# Patient Record
Sex: Female | Born: 1950 | Race: White | Hispanic: No | Smoking: Never smoker
Health system: Southern US, Community
[De-identification: ages and names within clinical notes are randomized; demographics above are authoritative.]

## PROBLEM LIST (undated history)

## (undated) DIAGNOSIS — Z8709 Personal history of other diseases of the respiratory system: Secondary | ICD-10-CM

## (undated) DIAGNOSIS — M2550 Pain in unspecified joint: Secondary | ICD-10-CM

## (undated) DIAGNOSIS — M2540 Effusion, unspecified joint: Secondary | ICD-10-CM

## (undated) DIAGNOSIS — M1990 Unspecified osteoarthritis, unspecified site: Secondary | ICD-10-CM

## (undated) DIAGNOSIS — J189 Pneumonia, unspecified organism: Secondary | ICD-10-CM

## (undated) HISTORY — PX: ESOPHAGOGASTRODUODENOSCOPY: SHX1529

---

## 1983-06-04 HISTORY — PX: PELVIC LAPAROSCOPY: SHX162

## 1990-06-03 HISTORY — PX: OTHER SURGICAL HISTORY: SHX169

## 2000-06-18 ENCOUNTER — Other Ambulatory Visit: Admission: RE | Admit: 2000-06-18 | Discharge: 2000-06-18 | Payer: Self-pay | Admitting: Obstetrics and Gynecology

## 2001-06-03 HISTORY — PX: TOTAL HIP ARTHROPLASTY: SHX124

## 2001-07-08 ENCOUNTER — Other Ambulatory Visit: Admission: RE | Admit: 2001-07-08 | Discharge: 2001-07-08 | Payer: Self-pay | Admitting: Obstetrics and Gynecology

## 2002-02-15 ENCOUNTER — Encounter: Payer: Self-pay | Admitting: Orthopedic Surgery

## 2002-02-17 ENCOUNTER — Inpatient Hospital Stay (HOSPITAL_COMMUNITY): Admission: RE | Admit: 2002-02-17 | Discharge: 2002-02-19 | Payer: Self-pay | Admitting: Orthopedic Surgery

## 2002-10-13 ENCOUNTER — Other Ambulatory Visit: Admission: RE | Admit: 2002-10-13 | Discharge: 2002-10-13 | Payer: Self-pay | Admitting: Obstetrics and Gynecology

## 2003-10-17 ENCOUNTER — Other Ambulatory Visit: Admission: RE | Admit: 2003-10-17 | Discharge: 2003-10-17 | Payer: Self-pay | Admitting: Obstetrics and Gynecology

## 2004-11-08 ENCOUNTER — Other Ambulatory Visit: Admission: RE | Admit: 2004-11-08 | Discharge: 2004-11-08 | Payer: Self-pay | Admitting: Obstetrics and Gynecology

## 2006-03-12 ENCOUNTER — Other Ambulatory Visit: Admission: RE | Admit: 2006-03-12 | Discharge: 2006-03-12 | Payer: Self-pay | Admitting: Obstetrics and Gynecology

## 2007-03-16 ENCOUNTER — Other Ambulatory Visit: Admission: RE | Admit: 2007-03-16 | Discharge: 2007-03-16 | Payer: Self-pay | Admitting: Obstetrics and Gynecology

## 2008-03-17 ENCOUNTER — Other Ambulatory Visit: Admission: RE | Admit: 2008-03-17 | Discharge: 2008-03-17 | Payer: Self-pay | Admitting: Obstetrics and Gynecology

## 2008-03-17 ENCOUNTER — Ambulatory Visit: Payer: Self-pay | Admitting: Obstetrics and Gynecology

## 2008-03-17 ENCOUNTER — Encounter: Payer: Self-pay | Admitting: Obstetrics and Gynecology

## 2009-04-12 ENCOUNTER — Other Ambulatory Visit: Admission: RE | Admit: 2009-04-12 | Discharge: 2009-04-12 | Payer: Self-pay | Admitting: Obstetrics and Gynecology

## 2009-04-12 ENCOUNTER — Ambulatory Visit: Payer: Self-pay | Admitting: Obstetrics and Gynecology

## 2009-04-12 ENCOUNTER — Encounter: Payer: Self-pay | Admitting: Obstetrics and Gynecology

## 2009-11-16 ENCOUNTER — Ambulatory Visit: Payer: Self-pay | Admitting: Obstetrics and Gynecology

## 2010-10-19 NOTE — Op Note (Signed)
NAME:  Katrina Gordon, Katrina Gordon NO.:  [id number]   MEDICAL RECORD NO.:  [id number]                   PATIENT TYPE:  INP   LOCATION:  2899                                 FACILITY:  [facility]   PHYSICIAN:  Feliberto Gottron. Turner Daniels, M.D.                DATE OF BIRTH:  [birth year]   DATE OF PROCEDURE:  02/17/2002  DATE OF DISCHARGE:                                 OPERATIVE REPORT   PREOPERATIVE DIAGNOSIS:  Degenerative arthritis, right hip.   POSTOPERATIVE DIAGNOSIS:  Degenerative arthritis, right hip.   PROCEDURE:  Right total hip arthroplasty.   SURGEON:  Feliberto Gottron. Turner Daniels, M.D.   ASSISTANTSLaural Benes. Jannet Mantis., and Patrick Jupiter, certified first  assist.   ANESTHESIA:  General endotracheal.   ESTIMATED BLOOD LOSS:  500 cc.   FLUIDS REPLACED:  1500 cc.   DRAINS:  None.   TOURNIQUET TIME:  None.   IMPLANTS:  We used a 52 mm DePuy one-hole sector cup, a 36 internal diameter  Metasul liner, an 18 x 13 x 42 SROM stem with an 18D large cone and a 36 mm  cobalt chrome Metasul head.   INDICATION FOR PROCEDURE:  A 60 year old woman with end-stage arthritis of  the right hip who has failed conservative measures with anti-inflammatory  medicine, physical therapy, weight loss, and activity modification.  She has  end-stage arthritis, bone-on bone by x-ray, and desires elective right total  hip arthroplasty.   DESCRIPTION OF PROCEDURE:  The patient identified by arm band and taken to  the operating room at [facility] main hospital, appropriate anesthetic monitors  were attached, and general endotracheal anesthesia induced with the patient  in the supine position.  She was then rolled into the left lateral decubitus  position and affixed there with a Stulberg Mark II pelvic clamp and the  right lower extremity prepped and draped in the usual sterile fashion from  the ankle to the hemipelvis.  The skin along the lateral hip and thigh was  infiltrated with a total of 20 cc of  0.5% Marcaine and epinephrine solution,  and we began the procedure by making a 15 cm incision centered over the  greater trochanter through the skin and subcutaneous tissue and IT band,  allowing a posterolateral approach to the hip joint.  A cobra retractor was  placed between the gluteus minimus and the superior hip joint capsule, a  second cobra between the inferior hip joint capsule and the quadratus  femoris.  The short external rotators and piriformis were tagged with two #2  Ethibond sutures and cut off their insertion on the intertrochanteric crest,  exposing the posterior aspect of the hip joint capsule.  We then developed  an acetabular-based capsular flap and tagged this with two #2 Ethibond  sutures, flexed and internally rotated the hip, dislocating the arthritic  bone-on-bone femoral head.  This was then cut one fingerbreadth  above the  lesser trochanter, and the hip was then placed in neutral rotation and the  proximal femur levered anteriorly off the anterior column.  A spike cobra  was placed in the cotyloid notch.  Posterior superior and posterior inferior  wing retractors were placed, allowing resection of the inflamed labrum and  inspection of the acetabulum, which again had bare-bone arthritic changes.  We then sequentially reamed up to a 51 mm basket reamer, obtaining good cut  into the subchondral edge of the bone down to bleeding bone.  We then  hammered into place a 52 mm one-hole sector cup and placed the central  occluder.  The cup was in 45 degrees of abduction and 15-20 degrees of  anteversion, and a trial 0 degree liner was then placed.  The hip was flexed  and internally rotated, exposing the proximal femur, which we entered with  the initiator, followed by axial reaming up to a 13 mm axial reamer with  good cortical chatter for the SROM system, and we put the 13.5 mm reamer to  a depth of about two-thirds.  We then conically milled up to an 18D cone,   followed by calcar milling to an 18D large.  A trial 18D large cone was then  placed in the femur, followed by a trial stem with a 42 base neck.  Because  of the anteversion of the neck, which was about 40 degrees, we took about 20  degrees off the anteversion in relation to the femoral neck and performed a  trial reduction with a 36 trial head.  The hip could be flexed to 90,  internally rotated to almost 80 degrees without dislocating, and in full  extension you could externally rotate her to 40 degrees again without  dislocating.  Stability was actually outstanding.  At this point the trial  components were removed.  On the acetabular side we hammered into place a  Metasul 0 degree liner, on the femoral side an 18D large cone, followed by  an 18 x 13 x 42 neck SROM stem in 20 degrees of retroversion in relation to  the cone for a total anteversion of the femur of about 20 degrees.  An NK +0  36 mm DePuy Metasul head was then hammered onto the stem, the hip reduced,  and once again excellent stability noted.  The wound was washed out with  normal saline solution and small bleeders once again identified and  cauterized.  The acetabular-based flap, short external rotators, and  piriformis were repaired back to the intertrochanteric crest through drill  holes with a #2 Ethibond suture.  The iliotibial band was closed with a  running #1 Vicryl suture, the subcutaneous tissue with 0 and 2-0 undyed  Vicryl suture, and the skin with running interlocking 3-0 nylon suture, a  dressing of Xeroform, 4 x 4 dressing sponges, and paper tape applied.  The  patient was then rolled supine, awakened, and taken to the recovery room  without difficulty.                                                  Feliberto Gottron. Turner Daniels, M.D.    Ovid Curd  D:  02/17/2002  T:  02/18/2002  Job:  [id number]

## 2010-10-19 NOTE — Discharge Summary (Signed)
NAME:  Katrina Gordon, Katrina Gordon                        ACCOUNT NO.:  [id number]   MEDICAL RECORD NO.:  [id number]                   PATIENT TYPE:  INP   LOCATION:  5037                                 FACILITY:  [facility]   PHYSICIAN:  Feliberto Gottron. Turner Daniels, M.D.                DATE OF BIRTH:  08-06-1950   DATE OF ADMISSION:  02/17/2002  DATE OF DISCHARGE:  02/19/2002                                 DISCHARGE SUMMARY   PRIMARY DIAGNOSIS FOR THIS ADMISSION:  Right hip end stage degenerative  joint disease.   PROCEDURE WHILE IN HOSPITAL:  Right total hip arthroplasty.   HISTORY OF PRESENT ILLNESS:  The patient is a 60 year old woman with end  stage arthritis of the right hip.  She has failed conservative measures with  anti-inflammatory medication, physical therapy, weight loss, and activity  modification.  The patient has bone on bone arthritis by x-ray and desires  an elective right total hip arthroplasty after discussing risks versus  benefits.   ALLERGIES:  NO KNOWN DRUG ALLERGIES.   MEDICATIONS ON ADMISSION:  Clindamycin 150 mg t.i.d. and Prempro 2.5 mg q.d.   PAST MEDICAL HISTORY:  _________ disease, adult history DJD.   PAST SURGICAL HISTORY:  C-section in 1986 and 1989 and ______ DVT.   SOCIAL HISTORY:  No tobacco.  Positive ethanol on occasion.  No IV drug  abuse.  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   FAMILY HISTORY:  Mother is alive at age 98.  Father alive at 47.  No  positive medical history to report.   REVIEW OF SYSTEMS:  Positive for glasses, toothache.  Denies any shortness  of breath or chest pain.   PHYSICAL EXAMINATION:  VITAL SIGNS:  The patient is afebrile, pulse 60,  respirations 16, blood pressure 144/84.  GENERAL:  The patient is 5' 6, 140 pound female.  HEAD:  Normocephalic, atraumatic.  EARS:  TMs are clear.  EYES:  Pupils equal, round, and reactive to light and accommodations.  NOSE:  Patent.  THROAT:  Benign.  NECK:  Full range of motion,  nontender.  CHEST:  Clear to auscultation and percussion.  HEART:  Regular rate and rhythm.  ABDOMEN:  Soft, nontender.  No masses.  Bowel sounds 2+.  EXTREMITIES:  Positive limp of the right hip with the right hip range of  motion 0 to 110 degrees of flexion, pain to any internal or external  rotation, internal rotation 20, external rotation 30 degrees, abduction 40  degrees, again all with pain.  SKIN:  Within normal limits.   PREOPERATIVE LABS:  Including CBC, CMP, chest x-ray, EKG, and PTT are all  within normal limits.   HOSPITAL COURSE:  The patient was taken to the [facility] operating room on the  day of admission, where she underwent a right total hip arthroplasty using a  52 mm DePuy one hole _____ cup, an ultimate  metal 36 mm 52-D with a metal to  metal head 36 mm +0 and an 18 x 13 x 160 x 42 mm SROM stem.  No drains were  placed.  The patient was placed on perioperative antibiotics and placed on  postoperative Coumadin prophylaxis with target INR of 1.5 to 2.0 performed  as protocol.  Placed on a PCA morphine pump for pain control.  Physical  therapy was begun the day of surgery.  On postoperative day one, the patient  was actually getting up and going to the bathroom with minimal assist.  She  was neurovascularly intact.  Blood pressure was not stable.  Urine output  300 cc.  She continued with physical therapy and was given a Saline bolus  because of the drop in blood pressure of 111/60 dropping down to 98.  Postoperative day one, the patient was awake and alert.  One episode of  nausea and vomiting.  Vital signs were stable.  She was afebrile.  The wound  was clean and dry.  She was neurovascularly intact.  Hemoglobin 10.3, PT  15.4. The thigh was soft, otherwise medically improving.  She continued with  physical therapy weightbearing as tolerated.  Postoperative day two, the  patient was complaining of mild headache, but was out of bed to the hall  without difficulty. TMAX  101.3 ranging 99.4 current.  Vital signs were  stable.  Blood pressure was decreased, but steady.  She was otherwise alert  and oriented times 3.  Hemoglobin 9.6, INR 1.8, PT 19.4.  Dressing was dry.  Wound was benign.  She was otherwise medically stable.  She continued  physical therapy on that day and passed her PT goals that evening.  Her PCA  was discontinued.  She was eating both solids and liquids without difficulty  and had successfully completed her PT goals.  Because of this she was  discharged home to the care of her family.  Medications written for Tylox  and Coumadin.  She will return to see [place] in approximately one week's time  for wound check and to see if she is having any difficulties with the wound  or any systemic fevers over 101.  She will continue weightbearing as  tolerated.  Ambulation using total hip precautions.  Her condition at the  time of discharge was stable and medically improved and her diet is regular.     Laural Benes. Jannet Mantis.                     Feliberto Gottron. Turner Daniels, M.D.    Merita Norton  D:  03/15/2002  T:  03/17/2002  Job:  [id number]

## 2011-01-21 ENCOUNTER — Other Ambulatory Visit: Payer: Self-pay | Admitting: Obstetrics and Gynecology

## 2011-01-23 ENCOUNTER — Other Ambulatory Visit: Payer: Self-pay | Admitting: Obstetrics and Gynecology

## 2011-01-30 ENCOUNTER — Encounter: Payer: Self-pay | Admitting: Gynecology

## 2011-01-30 DIAGNOSIS — N809 Endometriosis, unspecified: Secondary | ICD-10-CM | POA: Insufficient documentation

## 2011-02-08 ENCOUNTER — Encounter: Payer: Self-pay | Admitting: Obstetrics and Gynecology

## 2011-02-08 ENCOUNTER — Ambulatory Visit (INDEPENDENT_AMBULATORY_CARE_PROVIDER_SITE_OTHER): Payer: Managed Care, Other (non HMO) | Admitting: Obstetrics and Gynecology

## 2011-02-08 ENCOUNTER — Other Ambulatory Visit (HOSPITAL_COMMUNITY)
Admission: RE | Admit: 2011-02-08 | Discharge: 2011-02-08 | Disposition: A | Discharge status: Home / Self Care | Payer: Managed Care, Other (non HMO) | Source: Ambulatory Visit | Attending: Obstetrics and Gynecology | Admitting: Obstetrics and Gynecology

## 2011-02-08 DIAGNOSIS — M899 Disorder of bone, unspecified: Secondary | ICD-10-CM

## 2011-02-08 DIAGNOSIS — Z01419 Encounter for gynecological examination (general) (routine) without abnormal findings: Secondary | ICD-10-CM

## 2011-02-08 DIAGNOSIS — N951 Menopausal and female climacteric states: Secondary | ICD-10-CM

## 2011-02-08 DIAGNOSIS — M8580 Other specified disorders of bone density and structure, unspecified site: Secondary | ICD-10-CM

## 2011-02-08 DIAGNOSIS — Z780 Asymptomatic menopausal state: Secondary | ICD-10-CM

## 2011-02-08 MED ORDER — CONJ ESTROG-MEDROXYPROGEST ACE 0.3-1.5 MG PO TABS: 1.0000 | ORAL_TABLET | Freq: Every day | ORAL | Status: DC

## 2011-02-08 NOTE — Progress Notes (Signed)
Addended byCammie Mcgee T on: 02/08/2011 02:49 PM   Modules accepted: Orders

## 2011-02-08 NOTE — Progress Notes (Signed)
Patient came to see me today for an annual GYN exam. Because she's not been in she's been out of her HRT. She is very symptomatic and would like to go back on it. She is up-to-date on mammograms. She is due for followup bone density with osteopenia. No one has done lab work on her lately but she declined having it done today.  Physical examination: HEENT within normal limits. Neck: Thyroid not large. No masses. Supraclavicular nodes: not enlarged.   Breasts: Examined in both sitting midline position. No skin changes and no masses. Abdomen: Soft no guarding rebound or masses or hernia. Pelvic: External: Within normal limits. BUS: Within normal limits. Vaginal:within normal limits. Good estrogen effect. No evidence of cystocele rectocele or enterocele. Cervix: clean. Uterus: Normal size and shape. Adnexa: No masses. Rectovaginal exam: Confirmatory and negative. Extremities: Within normal limits.  Assessment: Menopausal symptoms  Plan: We discussed increased risk of breast cancer DVT and stroke. We offered her estrogen patch for this reason. She declined. She feels benefits outweigh the risks. We we initiated Prempro 0.3 mg. She will continue yearly mammograms. DEXA scheduled.

## 2011-03-09 ENCOUNTER — Other Ambulatory Visit: Payer: Self-pay | Admitting: Obstetrics and Gynecology

## 2012-02-27 ENCOUNTER — Other Ambulatory Visit: Payer: Self-pay | Admitting: Obstetrics and Gynecology

## 2012-03-23 ENCOUNTER — Encounter: Payer: Self-pay | Admitting: Obstetrics and Gynecology

## 2012-03-23 ENCOUNTER — Ambulatory Visit (INDEPENDENT_AMBULATORY_CARE_PROVIDER_SITE_OTHER): Payer: BC Managed Care – PPO | Admitting: Obstetrics and Gynecology

## 2012-03-23 VITALS — BP 124/80 | Ht 66.0 in | Wt 158.0 lb

## 2012-03-23 DIAGNOSIS — Z01419 Encounter for gynecological examination (general) (routine) without abnormal findings: Secondary | ICD-10-CM

## 2012-03-23 LAB — CBC WITH DIFFERENTIAL/PLATELET
Basophils Absolute: 0 10*3/uL (ref 0.0–0.1)
Basophils Relative: 1 % (ref 0–1)
Eosinophils Absolute: 0.3 10*3/uL (ref 0.0–0.7)
Eosinophils Relative: 6 % — ABNORMAL HIGH (ref 0–5)
HCT: 42.4 % (ref 36.0–46.0)
Lymphocytes Relative: 27 % (ref 12–46)
MCHC: 34.4 g/dL (ref 30.0–36.0)
MCV: 86.9 fL (ref 78.0–100.0)
Monocytes Absolute: 0.6 10*3/uL (ref 0.1–1.0)
Platelets: 287 10*3/uL (ref 150–400)
RDW: 12.9 % (ref 11.5–15.5)
WBC: 4.8 10*3/uL (ref 4.0–10.5)

## 2012-03-23 LAB — HDL CHOLESTEROL: HDL: 75 mg/dL (ref >39)

## 2012-03-23 MED ORDER — NYSTATIN-TRIAMCINOLONE 100000-0.1 UNIT/GM-% EX CREA: TOPICAL_CREAM | Freq: Two times a day (BID) | CUTANEOUS | Status: DC

## 2012-03-23 MED ORDER — PREMPRO 0.3-1.5 MG PO TABS: 1.0000 | ORAL_TABLET | Freq: Every day | ORAL | Status: DC

## 2012-03-23 NOTE — Patient Instructions (Signed)
Schedule mammogram. Schedule colonoscopy. Schedule  bone density.

## 2012-03-23 NOTE — Progress Notes (Signed)
Patient came to see me today for her annual GYN exam. She remains on Prempro 0.3 mg. She is now taking it every other day. That is adequate to prevent the hot flashes. She uses nystatin/triamcinolone cream for vulvitis after she works out. She is due for a mammogram. She's never had a colonoscopy. She's never had an abnormal Pap smear. Her last Pap smear was 2012. She is having no vaginal bleeding. She is having no pelvic pain. She has had one bone density which showed osteopenia. This was done in 2008.  Physical examination:Katrina Gordon present. HEENT within normal limits. Neck: Thyroid not large. No masses. Supraclavicular nodes: not enlarged. Breasts: Examined in both sitting and lying  position. No skin changes and no masses. Abdomen: Soft no guarding rebound or masses or hernia. Pelvic: External: Within normal limits. BUS: Within normal limits. Vaginal:within normal limits. Good estrogen effect. No evidence of cystocele rectocele or enterocele. Cervix: clean. Uterus: Normal size and shape. Adnexa: No masses. Rectovaginal exam: Confirmatory and negative. Extremities: Within normal limits.  Assessment: #1. Menopausal symptoms #2. Osteopenia #3. Recurrent vulvitis  Plan: Schedule mammogram and bone density. We discussed high risk of breast cancer with HRT. Discussed a new estrogen product with a serm. Schedule colonoscopy. Refilled Prempro and nystatin/triamcinolone cream. Pap not done.The new Pap smear guidelines were discussed with the patient.

## 2012-03-24 LAB — URINALYSIS W MICROSCOPIC + REFLEX CULTURE
Bacteria, UA: NONE SEEN
Casts: NONE SEEN
Crystals: NONE SEEN
Glucose, UA: NEGATIVE mg/dL
Hgb urine dipstick: NEGATIVE
Ketones, ur: NEGATIVE mg/dL
Leukocytes, UA: NEGATIVE
Specific Gravity, Urine: 1.023 (ref 1.005–1.030)
pH: 5 (ref 5.0–8.0)

## 2012-03-27 ENCOUNTER — Encounter: Payer: Self-pay | Admitting: Obstetrics and Gynecology

## 2012-03-27 ENCOUNTER — Other Ambulatory Visit: Payer: Self-pay | Admitting: Obstetrics and Gynecology

## 2012-03-27 DIAGNOSIS — E785 Hyperlipidemia, unspecified: Secondary | ICD-10-CM

## 2012-03-31 ENCOUNTER — Encounter: Payer: Self-pay | Admitting: Obstetrics and Gynecology

## 2012-04-09 ENCOUNTER — Ambulatory Visit (INDEPENDENT_AMBULATORY_CARE_PROVIDER_SITE_OTHER): Payer: BC Managed Care – PPO

## 2012-04-09 DIAGNOSIS — M8580 Other specified disorders of bone density and structure, unspecified site: Secondary | ICD-10-CM

## 2012-04-09 DIAGNOSIS — M949 Disorder of cartilage, unspecified: Secondary | ICD-10-CM

## 2012-04-17 ENCOUNTER — Encounter: Payer: Self-pay | Admitting: Obstetrics and Gynecology

## 2012-04-17 ENCOUNTER — Other Ambulatory Visit: Payer: BC Managed Care – PPO

## 2012-04-17 DIAGNOSIS — E785 Hyperlipidemia, unspecified: Secondary | ICD-10-CM

## 2012-04-17 LAB — LIPID PANEL
HDL: 80 mg/dL (ref >39)
LDL Cholesterol: 103 mg/dL — ABNORMAL HIGH (ref 0–99)
Triglycerides: 108 mg/dL (ref <150)
VLDL: 22 mg/dL (ref 0–40)

## 2012-07-18 ENCOUNTER — Other Ambulatory Visit: Payer: Self-pay

## 2013-02-03 ENCOUNTER — Other Ambulatory Visit: Payer: Self-pay | Admitting: Obstetrics and Gynecology

## 2013-04-08 ENCOUNTER — Other Ambulatory Visit: Payer: Self-pay

## 2013-04-27 ENCOUNTER — Other Ambulatory Visit: Payer: Self-pay | Admitting: Gynecology

## 2013-07-10 ENCOUNTER — Other Ambulatory Visit: Payer: Self-pay | Admitting: Gynecology

## 2013-08-14 ENCOUNTER — Other Ambulatory Visit: Payer: Self-pay | Admitting: Gynecology

## 2014-02-17 ENCOUNTER — Encounter: Payer: Self-pay | Admitting: Women's Health

## 2014-03-11 ENCOUNTER — Other Ambulatory Visit (HOSPITAL_COMMUNITY)
Admission: RE | Admit: 2014-03-11 | Discharge: 2014-03-11 | Disposition: A | Discharge status: Home / Self Care | Payer: 59 | Source: Ambulatory Visit | Attending: Gynecology | Admitting: Gynecology

## 2014-03-11 ENCOUNTER — Encounter: Payer: Self-pay | Admitting: Women's Health

## 2014-03-11 ENCOUNTER — Ambulatory Visit (INDEPENDENT_AMBULATORY_CARE_PROVIDER_SITE_OTHER): Payer: 59 | Admitting: Women's Health

## 2014-03-11 VITALS — BP 148/80 | Ht 66.0 in | Wt 157.0 lb

## 2014-03-11 DIAGNOSIS — Z01419 Encounter for gynecological examination (general) (routine) without abnormal findings: Secondary | ICD-10-CM | POA: Diagnosis present

## 2014-03-11 DIAGNOSIS — Z13220 Encounter for screening for lipoid disorders: Secondary | ICD-10-CM

## 2014-03-11 DIAGNOSIS — M8580 Other specified disorders of bone density and structure, unspecified site: Secondary | ICD-10-CM

## 2014-03-11 DIAGNOSIS — Z79890 Hormone replacement therapy (postmenopausal): Secondary | ICD-10-CM

## 2014-03-11 LAB — COMPREHENSIVE METABOLIC PANEL
ALK PHOS: 82 U/L (ref 39–117)
ALT: 9 U/L (ref 0–35)
AST: 16 U/L (ref 0–37)
Albumin: 4.4 g/dL (ref 3.5–5.2)
BUN: 15 mg/dL (ref 6–23)
CALCIUM: 9.2 mg/dL (ref 8.4–10.5)
CHLORIDE: 103 meq/L (ref 96–112)
CO2: 26 mEq/L (ref 19–32)
Creat: 0.92 mg/dL (ref 0.50–1.10)
Glucose, Bld: 81 mg/dL (ref 70–99)
Potassium: 4 mEq/L (ref 3.5–5.3)
SODIUM: 140 meq/L (ref 135–145)
Total Bilirubin: 0.9 mg/dL (ref 0.2–1.2)
Total Protein: 6.7 g/dL (ref 6.0–8.3)

## 2014-03-11 LAB — CBC WITH DIFFERENTIAL/PLATELET
Basophils Absolute: 0.1 10*3/uL (ref 0.0–0.1)
Basophils Relative: 1 % (ref 0–1)
Eosinophils Absolute: 0.4 10*3/uL (ref 0.0–0.7)
Eosinophils Relative: 7 % — ABNORMAL HIGH (ref 0–5)
HEMATOCRIT: 43 % (ref 36.0–46.0)
Hemoglobin: 14.3 g/dL (ref 12.0–15.0)
LYMPHS PCT: 30 % (ref 12–46)
Lymphs Abs: 1.5 10*3/uL (ref 0.7–4.0)
MCH: 29.3 pg (ref 26.0–34.0)
MCHC: 33.3 g/dL (ref 30.0–36.0)
MCV: 88.1 fL (ref 78.0–100.0)
Monocytes Absolute: 0.4 10*3/uL (ref 0.1–1.0)
Monocytes Relative: 8 % (ref 3–12)
NEUTROS ABS: 2.8 10*3/uL (ref 1.7–7.7)
NEUTROS PCT: 54 % (ref 43–77)
Platelets: 290 10*3/uL (ref 150–400)
RBC: 4.88 MIL/uL (ref 3.87–5.11)
RDW: 13.4 % (ref 11.5–15.5)
WBC: 5.1 10*3/uL (ref 4.0–10.5)

## 2014-03-11 LAB — LIPID PANEL
Cholesterol: 207 mg/dL — ABNORMAL HIGH (ref 0–200)
HDL: 74 mg/dL (ref >39)
LDL CALC: 117 mg/dL — AB (ref 0–99)
Total CHOL/HDL Ratio: 2.8 Ratio
Triglycerides: 78 mg/dL (ref <150)
VLDL: 16 mg/dL (ref 0–40)

## 2014-03-11 LAB — VITAMIN D 25 HYDROXY (VIT D DEFICIENCY, FRACTURES): VIT D 25 HYDROXY: 40 ng/mL (ref 30–89)

## 2014-03-11 MED ORDER — PREMPRO 0.3-1.5 MG PO TABS: ORAL_TABLET | ORAL | Status: DC

## 2014-03-11 NOTE — Progress Notes (Signed)
Katrina Gordon 04/16/1951 [id number]    History:    Presents for annual exam.  Postmenopausal with no bleeding on HRT. Has not had a colonoscopy. Normal Pap and mammogram history. 2013 T score of -1.2 spine, -0.9 left femoral neck. FRAX 15%/0.5%. Has not had Pneumovax or zostavac. Not sexually active.  Past medical history, past surgical history, family history and social history were all reviewed and documented in the EPIC chart. 2 daughters both doing well, works for a [employer] [place]. Father Alzheimer's in long-term care. Mother hypertension.  ROS:  A  12 point ROS was performed and pertinent positives and negatives are included.  Exam:  Filed Vitals:   03/11/14 0824  BP: 148/80    General appearance:  Normal Thyroid:  Symmetrical, normal in size, without palpable masses or nodularity. Respiratory  Auscultation:  Clear without wheezing or rhonchi Cardiovascular  Auscultation:  Regular rate, without rubs, murmurs or gallops  Edema/varicosities:  Not grossly evident Abdominal-abdominoplasty  Soft,nontender, without masses, guarding or rebound.  Liver/spleen:  No organomegaly noted  Hernia:  None appreciated  Skin  Inspection:  Grossly normal   Breasts: Examined lying and sitting.     Right: Without masses, retractions, discharge or axillary adenopathy.     Left: Without masses, retractions, discharge or axillary adenopathy. Gentitourinary   Inguinal/mons:  Normal without inguinal adenopathy  External genitalia:  Normal  BUS/Urethra/Skene's glands:  Normal  Vagina:  Normal  Cervix:  Normal  Uterus:   normal in size, shape and contour.  Midline and mobile  Adnexa/parametria:     Rt: Without masses or tenderness.   Lt: Without masses or tenderness.  Anus and perineum: Normal  Digital rectal exam: Normal sphincter tone without palpated masses or tenderness  Assessment/Plan:  63 y.o. DWF G2P2 for annual exam with no complaints.  Postmenopausal/no bleeding on  HRT Osteopenia without elevated FRAX Borderline blood pressure  Plan: Reviewed importance of monitoring of blood pressure, if continues greater than 130/80 to seek care with primary care for management. Repeat DEXA, will schedule, home safety, fall prevention importance of regular exercise reviewed. Continue healthy lifestyle of diet and exercise. SBE's, continue annual mammogram, calcium rich diet, vitamin D 2000 daily encouraged. Prempro 0.3 prescription, proper use given and reviewed risk for blood clots, strokes, breast cancer, reports taking every 2-3 days for prevention of hot flushes. CBC, comprehensive metabolic panel, lipid panel, vitamin D, UA, Pap, Pap normal 2012, new screening guidelines reviewed. Zostavac and Pneumovax vaccines recommended, instructed to have. Reviewed importance of screening colonoscopy instructed to [facility].    Snyder Colavito J [facility] MechanicsburgWHNP, 9:18 AM 03/11/2014

## 2014-03-11 NOTE — Addendum Note (Signed)
Addended by: Kem ParkinsonBARNES, Lilyannah Zuelke on: 03/11/2014 02:29 PM   Modules accepted: Orders

## 2014-03-11 NOTE — Patient Instructions (Signed)
Health Recommendations for Postmenopausal Women Respected and ongoing research has looked at the most common causes of death, disability, and poor quality of life in postmenopausal women. The causes include heart disease, diseases of blood vessels, diabetes, depression, cancer, and bone loss (osteoporosis). Many things can be done to help lower the chances of developing these and other common problems. CARDIOVASCULAR DISEASE Heart Disease: A heart attack is a medical emergency. Know the signs and symptoms of a heart attack. Below are things women can do to reduce their risk for heart disease.   Do not smoke. If you smoke, quit.  Aim for a healthy weight. Being overweight causes many preventable deaths. Eat a healthy and balanced diet and drink an adequate amount of liquids.  Get moving. Make a commitment to be more physically active. Aim for 30 minutes of activity on most, if not all days of the week.  Eat for heart health. Choose a diet that is low in saturated fat and cholesterol and eliminate trans fat. Include whole grains, vegetables, and fruits. Read and understand the labels on food containers before buying.  Know your numbers. Ask your caregiver to check your blood pressure, cholesterol (total, HDL, LDL, triglycerides) and blood glucose. Work with your caregiver on improving your entire clinical picture.  High blood pressure. Limit or stop your table salt intake (try salt substitute and food seasonings). Avoid salty foods and drinks. Read labels on food containers before buying. Eating well and exercising can help control high blood pressure. STROKE  Stroke is a medical emergency. Stroke may be the result of a blood clot in a blood vessel in the brain or by a brain hemorrhage (bleeding). Know the signs and symptoms of a stroke. To lower the risk of developing a stroke:  Avoid fatty foods.  Quit smoking.  Control your diabetes, blood pressure, and irregular heart rate. THROMBOPHLEBITIS  (BLOOD CLOT) OF THE LEG  Becoming overweight and leading a stationary lifestyle may also contribute to developing blood clots. Controlling your diet and exercising will help lower the risk of developing blood clots. CANCER SCREENING  Breast Cancer: Take steps to reduce your risk of breast cancer.  You should practice "breast self-awareness." This means understanding the normal appearance and feel of your breasts and should include breast self-examination. Any changes detected, no matter how small, should be reported to your caregiver.  After age 40, you should have a clinical breast exam (CBE) every year.  Starting at age 40, you should consider having a mammogram (breast X-ray) every year.  If you have a family history of breast cancer, talk to your caregiver about genetic screening.  If you are at high risk for breast cancer, talk to your caregiver about having an MRI and a mammogram every year.  Intestinal or Stomach Cancer: Tests to consider are a rectal exam, fecal occult blood, sigmoidoscopy, and colonoscopy. Women who are high risk may need to be screened at an earlier age and more often.  Cervical Cancer:  Beginning at age 30, you should have a Pap test every 3 years as long as the past 3 Pap tests have been normal.  If you have had past treatment for cervical cancer or a condition that could lead to cancer, you need Pap tests and screening for cancer for at least 20 years after your treatment.  If you had a hysterectomy for a problem that was not cancer or a condition that could lead to cancer, then you no longer need Pap tests.    If you are between ages 65 and 70, and you have had normal Pap tests going back 10 years, you no longer need Pap tests.  If Pap tests have been discontinued, risk factors (such as a new sexual partner) need to be reassessed to determine if screening should be resumed.  Some medical problems can increase the chance of getting cervical cancer. In these  cases, your caregiver may recommend more frequent screening and Pap tests.  Uterine Cancer: If you have vaginal bleeding after reaching menopause, you should notify your caregiver.  Ovarian Cancer: Other than yearly pelvic exams, there are no reliable tests available to screen for ovarian cancer at this time except for yearly pelvic exams.  Lung Cancer: Yearly chest X-rays can detect lung cancer and should be done on high risk women, such as cigarette smokers and women with chronic lung disease (emphysema).  Skin Cancer: A complete body skin exam should be done at your yearly examination. Avoid overexposure to the sun and ultraviolet light lamps. Use a strong sun block cream when in the sun. All of these things are important for lowering the risk of skin cancer. MENOPAUSE Menopause Symptoms: Hormone therapy products are effective for treating symptoms associated with menopause:  Moderate to severe hot flashes.  Night sweats.  Mood swings.  Headaches.  Tiredness.  Loss of sex drive.  Insomnia.  Other symptoms. Hormone replacement carries certain risks, especially in older women. Women who use or are thinking about using estrogen or estrogen with progestin treatments should discuss that with their caregiver. Your caregiver will help you understand the benefits and risks. The ideal dose of hormone replacement therapy is not known. The Food and Drug Administration (FDA) has concluded that hormone therapy should be used only at the lowest doses and for the shortest amount of time to reach treatment goals.  OSTEOPOROSIS Protecting Against Bone Loss and Preventing Fracture If you use hormone therapy for prevention of bone loss (osteoporosis), the risks for bone loss must outweigh the risk of the therapy. Ask your caregiver about other medications known to be safe and effective for preventing bone loss and fractures. To guard against bone loss or fractures, the following is recommended:  If  you are younger than age 50, take 1000 mg of calcium and at least 600 mg of Vitamin D per day.  If you are older than age 50 but younger than age 70, take 1200 mg of calcium and at least 600 mg of Vitamin D per day.  If you are older than age 70, take 1200 mg of calcium and at least 800 mg of Vitamin D per day. Smoking and excessive alcohol intake increases the risk of osteoporosis. Eat foods rich in calcium and vitamin D and do weight bearing exercises several times a week as your caregiver suggests. DIABETES Diabetes Mellitus: If you have type I or type 2 diabetes, you should keep your blood sugar under control with diet, exercise, and recommended medication. Avoid starchy and fatty foods, and too many sweets. Being overweight can make diabetes control more difficult. COGNITION AND MEMORY Cognition and Memory: Menopausal hormone therapy is not recommended for the prevention of cognitive disorders such as Alzheimer's disease or memory loss.  DEPRESSION  Depression may occur at any age, but it is common in elderly women. This may be because of physical, medical, social (loneliness), or financial problems and needs. If you are experiencing depression because of medical problems and control of symptoms, talk to your caregiver about this. Physical   activity and exercise may help with mood and sleep. Community and volunteer involvement may improve your sense of value and worth. If you have depression and you feel that the problem is getting worse or becoming severe, talk to your caregiver about which treatment options are best for you. ACCIDENTS  Accidents are common and can be serious in elderly woman. Prepare your house to prevent accidents. Eliminate throw rugs, place hand bars in bath, shower, and toilet areas. Avoid wearing high heeled shoes or walking on wet, snowy, and icy areas. Limit or stop driving if you have vision or hearing problems, or if you feel you are unsteady with your movements and  reflexes. HEPATITIS C Hepatitis C is a type of viral infection affecting the liver. It is spread mainly through contact with blood from an infected person. It can be treated, but if left untreated, it can lead to severe liver damage over the years. Many people who are infected do not know that the virus is in their blood. If you are a "baby-boomer", it is recommended that you have one screening test for Hepatitis C. IMMUNIZATIONS  Several immunizations are important to consider having during your senior years, including:   Tetanus, diphtheria, and pertussis booster shot.  Influenza every year before the flu season begins.  Pneumonia vaccine.  Shingles vaccine.  Others, as indicated based on your specific needs. Talk to your caregiver about these. Document Released: 07/12/2005 Document Revised: 10/04/2013 Document Reviewed: 03/07/2008 ExitCare Patient Information 2015 [employer]. This information is not intended to replace advice given to you by your health care provider. Make sure you discuss any questions you have with your health care provider.  

## 2014-03-12 LAB — URINALYSIS W MICROSCOPIC + REFLEX CULTURE
Bacteria, UA: NONE SEEN
Bilirubin Urine: NEGATIVE
CASTS: NONE SEEN
Crystals: NONE SEEN
GLUCOSE, UA: NEGATIVE mg/dL
Hgb urine dipstick: NEGATIVE
Ketones, ur: NEGATIVE mg/dL
Leukocytes, UA: NEGATIVE
Nitrite: NEGATIVE
PH: 6 (ref 5.0–8.0)
PROTEIN: NEGATIVE mg/dL
Specific Gravity, Urine: 1.022 (ref 1.005–1.030)
Urobilinogen, UA: 0.2 mg/dL (ref 0.0–1.0)

## 2014-03-15 LAB — CYTOLOGY - PAP

## 2014-04-04 ENCOUNTER — Encounter: Payer: Self-pay | Admitting: Women's Health

## 2014-05-06 ENCOUNTER — Other Ambulatory Visit: Payer: Self-pay | Admitting: Women's Health

## 2014-05-17 ENCOUNTER — Other Ambulatory Visit: Payer: Self-pay

## 2014-05-17 DIAGNOSIS — Z79890 Hormone replacement therapy (postmenopausal): Secondary | ICD-10-CM

## 2014-05-17 MED ORDER — PREMPRO 0.3-1.5 MG PO TABS: ORAL_TABLET | ORAL | Status: DC

## 2014-05-29 ENCOUNTER — Other Ambulatory Visit: Payer: Self-pay | Admitting: Women's Health

## 2014-05-30 NOTE — Telephone Encounter (Signed)
Katrina Gordon see note on 03/11/14 " Prempro 0.3 prescription, proper use given and reviewed risk for blood clots, strokes, breast cancer, reports taking every 2-3 days for prevention of hot flushes"

## 2014-08-11 ENCOUNTER — Other Ambulatory Visit: Payer: Self-pay

## 2014-08-11 MED ORDER — NYSTATIN-TRIAMCINOLONE 100000-0.1 UNIT/GM-% EX CREA: TOPICAL_CREAM | CUTANEOUS | Status: DC

## 2014-11-28 ENCOUNTER — Other Ambulatory Visit: Payer: Self-pay

## 2015-08-09 ENCOUNTER — Encounter: Payer: Self-pay | Admitting: Women's Health

## 2015-08-15 ENCOUNTER — Ambulatory Visit (INDEPENDENT_AMBULATORY_CARE_PROVIDER_SITE_OTHER): Payer: 59 | Admitting: Women's Health

## 2015-08-15 ENCOUNTER — Encounter: Payer: Self-pay | Admitting: Women's Health

## 2015-08-15 VITALS — BP 124/78 | Ht 65.75 in | Wt 156.0 lb

## 2015-08-15 DIAGNOSIS — R21 Rash and other nonspecific skin eruption: Secondary | ICD-10-CM | POA: Diagnosis not present

## 2015-08-15 DIAGNOSIS — M8580 Other specified disorders of bone density and structure, unspecified site: Secondary | ICD-10-CM | POA: Diagnosis not present

## 2015-08-15 DIAGNOSIS — M899 Disorder of bone, unspecified: Secondary | ICD-10-CM

## 2015-08-15 DIAGNOSIS — Z79890 Hormone replacement therapy (postmenopausal): Secondary | ICD-10-CM

## 2015-08-15 DIAGNOSIS — Z13220 Encounter for screening for lipoid disorders: Secondary | ICD-10-CM | POA: Diagnosis not present

## 2015-08-15 DIAGNOSIS — Z01419 Encounter for gynecological examination (general) (routine) without abnormal findings: Secondary | ICD-10-CM

## 2015-08-15 DIAGNOSIS — Z13820 Encounter for screening for osteoporosis: Secondary | ICD-10-CM

## 2015-08-15 LAB — COMPREHENSIVE METABOLIC PANEL
ALK PHOS: 74 U/L (ref 33–130)
ALT: 16 U/L (ref 6–29)
AST: 17 U/L (ref 10–35)
Albumin: 4.4 g/dL (ref 3.6–5.1)
BILIRUBIN TOTAL: 0.5 mg/dL (ref 0.2–1.2)
BUN: 20 mg/dL (ref 7–25)
CO2: 28 mmol/L (ref 20–31)
Calcium: 10.2 mg/dL (ref 8.6–10.4)
Chloride: 101 mmol/L (ref 98–110)
Creat: 0.83 mg/dL (ref 0.50–0.99)
Glucose, Bld: 91 mg/dL (ref 65–99)
Potassium: 4.7 mmol/L (ref 3.5–5.3)
Sodium: 141 mmol/L (ref 135–146)
Total Protein: 6.9 g/dL (ref 6.1–8.1)

## 2015-08-15 LAB — LIPID PANEL
Cholesterol: 223 mg/dL — ABNORMAL HIGH (ref 125–200)
HDL: 81 mg/dL (ref >=46)
LDL CALC: 124 mg/dL (ref <130)
Total CHOL/HDL Ratio: 2.8 Ratio (ref <=5.0)
Triglycerides: 88 mg/dL (ref <150)
VLDL: 18 mg/dL (ref <30)

## 2015-08-15 LAB — CBC WITH DIFFERENTIAL/PLATELET
BASOS ABS: 0.1 10*3/uL (ref 0.0–0.1)
BASOS PCT: 1 % (ref 0–1)
EOS ABS: 0.4 10*3/uL (ref 0.0–0.7)
Eosinophils Relative: 8 % — ABNORMAL HIGH (ref 0–5)
HCT: 45.1 % (ref 36.0–46.0)
Hemoglobin: 15 g/dL (ref 12.0–15.0)
LYMPHS PCT: 27 % (ref 12–46)
Lymphs Abs: 1.5 10*3/uL (ref 0.7–4.0)
MCH: 29.8 pg (ref 26.0–34.0)
MCHC: 33.3 g/dL (ref 30.0–36.0)
MCV: 89.5 fL (ref 78.0–100.0)
MONO ABS: 0.5 10*3/uL (ref 0.1–1.0)
MPV: 9.4 fL (ref 8.6–12.4)
Monocytes Relative: 10 % (ref 3–12)
Neutro Abs: 2.9 10*3/uL (ref 1.7–7.7)
Neutrophils Relative %: 54 % (ref 43–77)
PLATELETS: 305 10*3/uL (ref 150–400)
RBC: 5.04 MIL/uL (ref 3.87–5.11)
RDW: 13 % (ref 11.5–15.5)
WBC: 5.4 10*3/uL (ref 4.0–10.5)

## 2015-08-15 MED ORDER — PREMPRO 0.3-1.5 MG PO TABS: ORAL_TABLET | ORAL | Status: DC

## 2015-08-15 MED ORDER — NYSTATIN-TRIAMCINOLONE 100000-0.1 UNIT/GM-% EX CREA: TOPICAL_CREAM | CUTANEOUS | Status: DC

## 2015-08-15 NOTE — Patient Instructions (Signed)

## 2015-08-15 NOTE — Progress Notes (Addendum)
Tristin Scarantino 11/21/1950 [id number]    History:    Presents for annual exam.  Postmenopausal with no bleeding on HRT. Not sexually active in years. Normal Pap and mammogram history. 2013 T score -1.2 at spine, -0.9 hip FRAX 15%/0.5%. Has not had Pneumovax or Zostavax. Has never had a colonoscopy.   Past medical history, past surgical history, family history and social history were all reviewed and documented in the EPIC chart. Works for [employer], travels with job. Extremely healthy lifestyle of exercise and diet. Father Alzheimer's, mother hypertension.  ROS:  A ROS was performed and pertinent positives and negatives are included.  Exam:  Filed Vitals:   08/15/15 1015  BP: 124/78    General appearance:  Normal Thyroid:  Symmetrical, normal in size, without palpable masses or nodularity. Respiratory  Auscultation:  Clear without wheezing or rhonchi Cardiovascular  Auscultation:  Regular rate, without rubs, murmurs or gallops  Edema/varicosities:  Not grossly evident Abdominal  Soft,nontender, without masses, guarding or rebound.  Liver/spleen:  No organomegaly noted  Hernia:  None appreciated  Skin  Inspection:  Grossly normal   Breasts: Examined lying and sitting.     Right: Without masses, retractions, discharge or axillary adenopathy.     Left: Without masses, retractions, discharge or axillary adenopathy. Gentitourinary   Inguinal/mons:  Normal without inguinal adenopathy  External genitalia:  Normal  BUS/Urethra/Skene's glands:  Normal  Vagina:  Normal  Cervix:  Normal  Uterus:   normal in size, shape and contour.  Midline and mobile  Adnexa/parametria:     Rt: Without masses or tenderness.   Lt: Without masses or tenderness.  Anus and perineum: Normal  Digital rectal exam: Normal sphincter tone without palpated masses or tenderness  Assessment/Plan:  65 y.o. DWF G2P2 for annual exam with no complaints.  Postmenopausal/ HRT/no bleeding Osteopenia  without elevated FRAX In need of routine vaccines and screening colonoscopy  Plan: Prempro 0.3/0.45 prescription, proper use, risks of blood clots, strokes, breast cancer, states would like to continue feels better on. Women's health initiative reviewed. Reviewed importance of screening colonoscopy, [facility] information given and reviewed, instructed to schedule. Pneumovax and Zostavax both recommended and encouraged to get at pharmacy. Annual  flu vaccine recommended. Repeat DEXA, continue healthy lifestyle of exercise and active lifestyle. SBE's, continue annual 3-D screening mammogram, calcium rich diet, vitamin D 1000 daily encouraged, vitamin D 40 in 2016.. CBC, lipid panel, CMP, UA, Pap normal 2015 new screening guidelines reviewed.Marland Kitchen.    Harrington ChallengerYOUNG,NANCY J Doctors HospitalWHNP, 11:25 AM 08/15/2015

## 2015-08-16 LAB — URINALYSIS W MICROSCOPIC + REFLEX CULTURE
BILIRUBIN URINE: NEGATIVE
Bacteria, UA: NONE SEEN [HPF]
Casts: NONE SEEN [LPF]
Glucose, UA: NEGATIVE
HGB URINE DIPSTICK: NEGATIVE
Ketones, ur: NEGATIVE
NITRITE: NEGATIVE
PROTEIN: NEGATIVE
RBC / HPF: NONE SEEN RBC/HPF (ref <=2)
Specific Gravity, Urine: 1.024 (ref 1.001–1.035)
YEAST: NONE SEEN [HPF]
pH: 5 (ref 5.0–8.0)

## 2015-08-17 ENCOUNTER — Encounter: Payer: Self-pay | Admitting: Women's Health

## 2015-08-17 LAB — URINE CULTURE

## 2016-02-07 ENCOUNTER — Other Ambulatory Visit: Payer: Self-pay | Admitting: Orthopedic Surgery

## 2016-03-01 ENCOUNTER — Other Ambulatory Visit (HOSPITAL_COMMUNITY): Payer: Self-pay

## 2016-03-01 ENCOUNTER — Encounter (HOSPITAL_COMMUNITY): Payer: Self-pay

## 2016-03-01 ENCOUNTER — Encounter (HOSPITAL_COMMUNITY)
Admission: RE | Admit: 2016-03-01 | Discharge: 2016-03-01 | Disposition: A | Discharge status: Home / Self Care | Payer: 59 | Source: Ambulatory Visit | Attending: Orthopedic Surgery | Admitting: Orthopedic Surgery

## 2016-03-01 ENCOUNTER — Ambulatory Visit (HOSPITAL_COMMUNITY)
Admission: RE | Admit: 2016-03-01 | Discharge: 2016-03-01 | Disposition: A | Discharge status: Home / Self Care | Payer: 59 | Source: Ambulatory Visit | Attending: Orthopedic Surgery | Admitting: Orthopedic Surgery

## 2016-03-01 DIAGNOSIS — Z01812 Encounter for preprocedural laboratory examination: Secondary | ICD-10-CM | POA: Diagnosis not present

## 2016-03-01 DIAGNOSIS — Z01810 Encounter for preprocedural cardiovascular examination: Secondary | ICD-10-CM | POA: Diagnosis not present

## 2016-03-01 DIAGNOSIS — J984 Other disorders of lung: Secondary | ICD-10-CM | POA: Diagnosis not present

## 2016-03-01 DIAGNOSIS — I700 Atherosclerosis of aorta: Secondary | ICD-10-CM | POA: Diagnosis not present

## 2016-03-01 DIAGNOSIS — Z01818 Encounter for other preprocedural examination: Secondary | ICD-10-CM | POA: Diagnosis present

## 2016-03-01 HISTORY — DX: Unspecified osteoarthritis, unspecified site: M19.90

## 2016-03-01 HISTORY — DX: Pain in unspecified joint: M25.50

## 2016-03-01 HISTORY — DX: Pneumonia, unspecified organism: J18.9

## 2016-03-01 HISTORY — DX: Personal history of other diseases of the respiratory system: Z87.09

## 2016-03-01 HISTORY — DX: Effusion, unspecified joint: M25.40

## 2016-03-01 LAB — CBC WITH DIFFERENTIAL/PLATELET
BASOS PCT: 1 %
Basophils Absolute: 0.1 10*3/uL (ref 0.0–0.1)
EOS ABS: 0.4 10*3/uL (ref 0.0–0.7)
Eosinophils Relative: 6 %
HCT: 43 % (ref 36.0–46.0)
HEMOGLOBIN: 14 g/dL (ref 12.0–15.0)
LYMPHS ABS: 1.8 10*3/uL (ref 0.7–4.0)
Lymphocytes Relative: 30 %
MCH: 29.9 pg (ref 26.0–34.0)
MCHC: 32.6 g/dL (ref 30.0–36.0)
MCV: 91.9 fL (ref 78.0–100.0)
Monocytes Absolute: 0.4 10*3/uL (ref 0.1–1.0)
Monocytes Relative: 7 %
NEUTROS ABS: 3.3 10*3/uL (ref 1.7–7.7)
NEUTROS PCT: 56 %
Platelets: 264 10*3/uL (ref 150–400)
RBC: 4.68 MIL/uL (ref 3.87–5.11)
RDW: 12.5 % (ref 11.5–15.5)
WBC: 5.9 10*3/uL (ref 4.0–10.5)

## 2016-03-01 LAB — SURGICAL PCR SCREEN
MRSA, PCR: NEGATIVE
Staphylococcus aureus: NEGATIVE

## 2016-03-01 LAB — TYPE AND SCREEN
ABO/RH(D): B POS
ANTIBODY SCREEN: NEGATIVE

## 2016-03-01 LAB — URINALYSIS, ROUTINE W REFLEX MICROSCOPIC
BILIRUBIN URINE: NEGATIVE
GLUCOSE, UA: NEGATIVE mg/dL
HGB URINE DIPSTICK: NEGATIVE
KETONES UR: NEGATIVE mg/dL
Leukocytes, UA: NEGATIVE
Nitrite: NEGATIVE
PH: 5.5 (ref 5.0–8.0)
Protein, ur: NEGATIVE mg/dL
Specific Gravity, Urine: 1.029 (ref 1.005–1.030)

## 2016-03-01 LAB — BASIC METABOLIC PANEL
ANION GAP: 9 (ref 5–15)
BUN: 19 mg/dL (ref 6–20)
CALCIUM: 10.1 mg/dL (ref 8.9–10.3)
CHLORIDE: 105 mmol/L (ref 101–111)
CO2: 27 mmol/L (ref 22–32)
CREATININE: 0.93 mg/dL (ref 0.44–1.00)
GFR calc non Af Amer: >60 mL/min (ref >60)
Glucose, Bld: 94 mg/dL (ref 65–99)
Potassium: 4.6 mmol/L (ref 3.5–5.1)
SODIUM: 141 mmol/L (ref 135–145)

## 2016-03-01 LAB — ABO/RH: ABO/RH(D): B POS

## 2016-03-01 LAB — PROTIME-INR
INR: 0.94
PROTHROMBIN TIME: 12.6 s (ref 11.4–15.2)

## 2016-03-01 LAB — APTT: aPTT: 33 seconds (ref 24–36)

## 2016-03-01 MED ORDER — CHLORHEXIDINE GLUCONATE 4 % EX LIQD: 60.0000 mL | Freq: Once | CUTANEOUS | Status: DC

## 2016-03-01 NOTE — Progress Notes (Addendum)
Cardiologist denies  Medical Md doesn't have one  Echo denies  Stress test denies  Heart cath denies  EKG denies in past yr  CXR denies in past yr 

## 2016-03-01 NOTE — Pre-Procedure Instructions (Signed)
Katrina Gordon  03/01/2016      [facility] - [name], [place] - [address] [facility] [place] [address] Phone: [phone] Fax: [phone]  [facility] - [place], [place] - [address] [place] [address] Phone: [phone] Fax: [phone]  [facility] - [place], [place] - [address] AT [facility] [address] [facility] [address] [facility] [address] [place] [address] Phone: [phone] Fax: [phone]  [facility] - [place], [place] - [address] AT [facility] OF [address] & [facility] [address] Katrina Gordon [place] [address] Phone: [phone] Fax: [phone]    Your procedure is scheduled on Mon, Oct 9 @ 10:05 AM  Report to [facility] Admitting at 8:00 AM  Call this number if you have problems the morning of surgery:  [phone]   Remember:  Do not eat food or drink liquids after midnight.             No Goody's,BC's,Aleve,Advil,Motrin,Ibuprofen,Fish Oil,or any Herbal Medications.    Do not wear jewelry, make-up or nail polish.  Do not wear lotions, powders,colognes, or deoderant.  Do not shave 48 hours prior to surgery.     Do not bring valuables to the hospital.  [facility] is not responsible for any belongings or valuables.  Contacts, dentures or bridgework may not be worn into surgery.  Leave your suitcase in the car.  After surgery it may be brought to your room.  For patients admitted to the hospital, discharge time will be determined by your treatment team.  Patients discharged the day of surgery will not be allowed to drive home.    [facility]  Before surgery, you can play an important role.  Because skin is not sterile, your skin needs to be as free of germs as possible.  You can reduce the number of germs on you skin by washing with CHG (chlorahexidine gluconate) soap  before surgery.  CHG is an antiseptic cleaner which kills germs and bonds with the skin to continue killing germs even after washing.  Please DO NOT use if you have an allergy to CHG or antibacterial soaps.  If your skin becomes reddened/irritated stop using the CHG and inform your nurse when you arrive at Short Stay.  Do not shave (including legs and underarms) for at least 48 hours prior to the first CHG shower.  You may shave your face.  Please follow these instructions carefully:   1.  Shower with CHG Soap the night before surgery and the                                morning of Surgery.  2.  If you choose to wash your hair, wash your hair first as usual with your       normal shampoo.  3.  After you shampoo, rinse your hair and body thoroughly to remove the                      Shampoo.  4.  Use CHG as you would any other liquid soap.  You can apply chg directly       to the skin and wash gently with scrungie or a clean washcloth.  5.  Apply the CHG Soap to  your body ONLY FROM THE NECK DOWN.        Do not use on open wounds or open sores.  Avoid contact with your eyes,       ears, mouth and genitals (private parts).  Wash genitals (private parts)       with your normal soap.  6.  Wash thoroughly, paying special attention to the area where your surgery        will be performed.  7.  Thoroughly rinse your body with warm water from the neck down.  8.  DO NOT shower/wash with your normal soap after using and rinsing off       the CHG Soap.  9.  Pat yourself dry with a clean towel.            10.  Wear clean pajamas.            11.  Place clean sheets on your bed the night of your first shower and do not        sleep with pets.  Day of Surgery  Do not apply any lotions/deoderants the morning of surgery.  Please wear clean clothes to the hospital/surgery center.    Please read over the following fact sheets that you were given. Pain Booklet, Coughing and Deep Breathing, MRSA Information  and Surgical Site Infection Prevention

## 2016-03-07 NOTE — H&P (Signed)
TOTAL HIP ADMISSION H&P  Patient is admitted for left total hip arthroplasty.  Subjective:  Chief Complaint: left hip pain  HPI: Katrina Gordon, 65 y.o. female, has a history of pain and functional disability in the left hip(s) due to arthritis and patient has failed non-surgical conservative treatments for greater than 12 weeks to include NSAID's and/or analgesics, flexibility and strengthening excercises, weight reduction as appropriate and activity modification.  Onset of symptoms was gradual starting 3 years ago with rapidlly worsening course since that time.The patient noted no past surgery on the left hip(s).  Patient currently rates pain in the left hip at 10 out of 10 with activity. Patient has night pain, worsening of pain with activity and weight bearing, trendelenberg gait, pain that interfers with activities of daily living and pain with passive range of motion. Patient has evidence of joint space narrowing by imaging studies. This condition presents safety issues increasing the risk of falls.  There is no current active infection.  Patient Active Problem List   Diagnosis Date Noted  . Osteopenia 08/15/2015  . Endometriosis    Past Medical History:  Diagnosis Date  . Arthritis   . History of bronchitis 1 yr ago  . Joint pain   . Joint swelling   . Pneumonia 6 wks ago    Past Surgical History:  Procedure Laterality Date  . arthroscopic surg  1992   Ligament replaced  . CESAREAN SECTION     x 2   . ESOPHAGOGASTRODUODENOSCOPY    . PELVIC LAPAROSCOPY  1985   Diag lap  . TOTAL HIP ARTHROPLASTY  2003    No prescriptions prior to admission.   Allergies  Allergen Reactions  . Codeine Other (See Comments)    Doesn't really like    Social History  Substance Use Topics  . Smoking status: Never Smoker  . Smokeless tobacco: Never Used  . Alcohol use 0.0 oz/week     Comment: Rare    Family History  Problem Relation Age of Onset  . Hypertension Mother   . Diabetes  Brother      Review of Systems  Constitutional: Negative.   HENT: Negative.   Eyes: Negative.   Respiratory: Negative.   Cardiovascular: Negative.   Gastrointestinal: Negative.   Genitourinary: Negative.   Musculoskeletal: Positive for joint pain.  Skin: Negative.   Neurological: Negative.   Endo/Heme/Allergies: Negative.   Psychiatric/Behavioral: Negative.     Objective:  Physical Exam  Constitutional: She is oriented to person, place, and time. She appears well-developed and well-nourished.  HENT:  Head: Normocephalic and atraumatic.  Eyes: Pupils are equal, round, and reactive to light.  Neck: Normal range of motion. Neck supple.  Cardiovascular: Intact distal pulses.   Respiratory: Effort normal.  Musculoskeletal: She exhibits tenderness.  Patient's left hip is highly irritable to internal rotation.  The left knee comes to full extension and flexes 130 collateral ligaments are stable she is tender along the posterior lateral joint line, 1+ but not near as tender as she is when you rotate the left hip  Neurological: She is alert and oriented to person, place, and time.  Skin: Skin is warm and dry.  Psychiatric: She has a normal mood and affect. Her behavior is normal. Judgment and thought content normal.    Vital signs in last 24 hours:    Labs:   Estimated body mass index is 24.92 kg/m as calculated from the following:   Height as of 03/01/16: 5\' 6"  (1.676 m).  Weight as of 03/01/16: 70 kg (154 lb 6.4 oz).   Imaging Review Plain radiographs demonstrate end-stage arthritis superior pole of the left hip with periarticular osteophyte formation and subchondral cysts superiorly.  Assessment/Plan:  End stage arthritis, left hip(s)  The patient history, physical examination, clinical judgement of the provider and imaging studies are consistent with end stage degenerative joint disease of the left hip(s) and total hip arthroplasty is deemed medically necessary. The  treatment options including medical management, injection therapy, arthroscopy and arthroplasty were discussed at length. The risks and benefits of total hip arthroplasty were presented and reviewed. The risks due to aseptic loosening, infection, stiffness, dislocation/subluxation,  thromboembolic complications and other imponderables were discussed.  The patient acknowledged the explanation, agreed to proceed with the plan and consent was signed. Patient is being admitted for inpatient treatment for surgery, pain control, PT, OT, prophylactic antibiotics, VTE prophylaxis, progressive ambulation and ADL's and discharge planning.The patient is planning to be discharged home with home health services

## 2016-03-10 DIAGNOSIS — M1612 Unilateral primary osteoarthritis, left hip: Secondary | ICD-10-CM | POA: Diagnosis present

## 2016-03-10 MED ORDER — CEFAZOLIN SODIUM-DEXTROSE 2-4 GM/100ML-% IV SOLN
2.0000 g | INTRAVENOUS | Status: AC
  Administered 2016-03-11: 2 g via INTRAVENOUS
  Filled 2016-03-10: qty 100

## 2016-03-11 ENCOUNTER — Encounter (HOSPITAL_COMMUNITY)
Admission: RE | Disposition: A | Discharge status: Home Health | Payer: Self-pay | Source: Ambulatory Visit | Attending: Orthopedic Surgery

## 2016-03-11 ENCOUNTER — Encounter (HOSPITAL_COMMUNITY): Payer: Self-pay | Admitting: Certified Registered Nurse Anesthetist

## 2016-03-11 ENCOUNTER — Inpatient Hospital Stay (HOSPITAL_COMMUNITY)
Admission: RE | Admit: 2016-03-11 | Discharge: 2016-03-12 | DRG: 470 | Disposition: A | Discharge status: Home Health | Payer: 59 | Source: Ambulatory Visit | Attending: Orthopedic Surgery | Admitting: Orthopedic Surgery

## 2016-03-11 ENCOUNTER — Inpatient Hospital Stay (HOSPITAL_COMMUNITY): Payer: 59 | Admitting: Certified Registered Nurse Anesthetist

## 2016-03-11 ENCOUNTER — Inpatient Hospital Stay (HOSPITAL_COMMUNITY): Payer: 59

## 2016-03-11 DIAGNOSIS — M8580 Other specified disorders of bone density and structure, unspecified site: Secondary | ICD-10-CM | POA: Diagnosis present

## 2016-03-11 DIAGNOSIS — Z79890 Hormone replacement therapy (postmenopausal): Secondary | ICD-10-CM

## 2016-03-11 DIAGNOSIS — M1612 Unilateral primary osteoarthritis, left hip: Principal | ICD-10-CM | POA: Diagnosis present

## 2016-03-11 DIAGNOSIS — Z419 Encounter for procedure for purposes other than remedying health state, unspecified: Secondary | ICD-10-CM

## 2016-03-11 DIAGNOSIS — Z96641 Presence of right artificial hip joint: Secondary | ICD-10-CM

## 2016-03-11 DIAGNOSIS — M25552 Pain in left hip: Secondary | ICD-10-CM | POA: Diagnosis present

## 2016-03-11 HISTORY — PX: TOTAL HIP ARTHROPLASTY: SHX124

## 2016-03-11 SURGERY — ARTHROPLASTY, HIP, TOTAL, ANTERIOR APPROACH
Anesthesia: Monitor Anesthesia Care | Site: Hip | Laterality: Left

## 2016-03-11 MED ORDER — ONDANSETRON HCL 4 MG/2ML IJ SOLN: 4.0000 mg | Freq: Once | INTRAMUSCULAR | Status: DC | PRN

## 2016-03-11 MED ORDER — KCL IN DEXTROSE-NACL 20-5-0.45 MEQ/L-%-% IV SOLN
INTRAVENOUS | Status: DC
  Administered 2016-03-11: 18:00:00 via INTRAVENOUS
  Filled 2016-03-11: qty 1000

## 2016-03-11 MED ORDER — PHENYLEPHRINE 40 MCG/ML (10ML) SYRINGE FOR IV PUSH (FOR BLOOD PRESSURE SUPPORT)
PREFILLED_SYRINGE | INTRAVENOUS | Status: AC
  Filled 2016-03-11: qty 10

## 2016-03-11 MED ORDER — METHOCARBAMOL 1000 MG/10ML IJ SOLN
500.0000 mg | Freq: Four times a day (QID) | INTRAMUSCULAR | Status: DC | PRN
  Filled 2016-03-11: qty 5

## 2016-03-11 MED ORDER — CONJ ESTROG-MEDROXYPROGEST ACE 0.3-1.5 MG PO TABS: 1.0000 | ORAL_TABLET | Freq: Every day | ORAL | Status: DC

## 2016-03-11 MED ORDER — OXYCODONE HCL 5 MG PO TABS
5.0000 mg | ORAL_TABLET | ORAL | Status: DC | PRN
  Administered 2016-03-11 (×3): 10 mg via ORAL
  Filled 2016-03-11 (×2): qty 2

## 2016-03-11 MED ORDER — DOCUSATE SODIUM 100 MG PO CAPS
100.0000 mg | ORAL_CAPSULE | Freq: Two times a day (BID) | ORAL | Status: DC
  Administered 2016-03-12: 100 mg via ORAL
  Filled 2016-03-11 (×2): qty 1

## 2016-03-11 MED ORDER — OXYCODONE-ACETAMINOPHEN 5-325 MG PO TABS: 1.0000 | ORAL_TABLET | ORAL | 0 refills | Status: DC | PRN

## 2016-03-11 MED ORDER — ASPIRIN EC 325 MG PO TBEC
325.0000 mg | DELAYED_RELEASE_TABLET | Freq: Every day | ORAL | Status: DC
  Administered 2016-03-12: 325 mg via ORAL
  Filled 2016-03-11: qty 1

## 2016-03-11 MED ORDER — BUPIVACAINE-EPINEPHRINE 0.25% -1:200000 IJ SOLN
INTRAMUSCULAR | Status: AC
  Filled 2016-03-11: qty 1

## 2016-03-11 MED ORDER — BETAMETHASONE SOD PHOS & ACET 6 (3-3) MG/ML IJ SUSP
3.0000 mg | Freq: Once | INTRAMUSCULAR | Status: DC
  Filled 2016-03-11: qty 0.5

## 2016-03-11 MED ORDER — ESTROGENS CONJUGATED 0.3 MG PO TABS
0.3000 mg | ORAL_TABLET | Freq: Every day | ORAL | Status: DC
  Administered 2016-03-12: 0.3 mg via ORAL
  Filled 2016-03-11 (×2): qty 1

## 2016-03-11 MED ORDER — TRANEXAMIC ACID 1000 MG/10ML IV SOLN
1000.0000 mg | INTRAVENOUS | Status: AC
  Administered 2016-03-11: 1000 mg via INTRAVENOUS
  Filled 2016-03-11: qty 10

## 2016-03-11 MED ORDER — ONDANSETRON HCL 4 MG/2ML IJ SOLN
4.0000 mg | Freq: Four times a day (QID) | INTRAMUSCULAR | Status: DC | PRN
Start: 2016-03-11 — End: 2016-03-12

## 2016-03-11 MED ORDER — METHOCARBAMOL 500 MG PO TABS: 500.0000 mg | ORAL_TABLET | Freq: Two times a day (BID) | ORAL | 0 refills | Status: DC

## 2016-03-11 MED ORDER — MIDAZOLAM HCL 5 MG/5ML IJ SOLN
INTRAMUSCULAR | Status: DC | PRN
  Administered 2016-03-11 (×2): 1 mg via INTRAVENOUS
  Administered 2016-03-11: 2 mg via INTRAVENOUS

## 2016-03-11 MED ORDER — BUPIVACAINE HCL 0.5 % IJ SOLN
INTRAMUSCULAR | Status: DC | PRN
  Administered 2016-03-11: 9 mL via INTRA_ARTICULAR

## 2016-03-11 MED ORDER — MEDROXYPROGESTERONE ACETATE 2.5 MG PO TABS
2.5000 mg | ORAL_TABLET | Freq: Every day | ORAL | Status: DC
  Administered 2016-03-12: 2.5 mg via ORAL
  Filled 2016-03-11 (×2): qty 1

## 2016-03-11 MED ORDER — FENTANYL CITRATE (PF) 100 MCG/2ML IJ SOLN
25.0000 ug | INTRAMUSCULAR | Status: DC | PRN
  Administered 2016-03-11: 50 ug via INTRAVENOUS

## 2016-03-11 MED ORDER — FENTANYL CITRATE (PF) 100 MCG/2ML IJ SOLN
INTRAMUSCULAR | Status: DC | PRN
  Administered 2016-03-11 (×2): 50 ug via INTRAVENOUS

## 2016-03-11 MED ORDER — MENTHOL 3 MG MT LOZG: 1.0000 | LOZENGE | OROMUCOSAL | Status: DC | PRN

## 2016-03-11 MED ORDER — METOCLOPRAMIDE HCL 5 MG PO TABS: 5.0000 mg | ORAL_TABLET | Freq: Three times a day (TID) | ORAL | Status: DC | PRN

## 2016-03-11 MED ORDER — BISACODYL 5 MG PO TBEC
5.0000 mg | DELAYED_RELEASE_TABLET | Freq: Every day | ORAL | Status: DC | PRN
Start: 2016-03-11 — End: 2016-03-12

## 2016-03-11 MED ORDER — PROPOFOL 500 MG/50ML IV EMUL
INTRAVENOUS | Status: DC | PRN
  Administered 2016-03-11: 75 ug/kg/min via INTRAVENOUS

## 2016-03-11 MED ORDER — TRANEXAMIC ACID 1000 MG/10ML IV SOLN
2000.0000 mg | Freq: Once | INTRAVENOUS | Status: AC
  Administered 2016-03-11: 2000 mg via TOPICAL
  Filled 2016-03-11: qty 20

## 2016-03-11 MED ORDER — METHOCARBAMOL 500 MG PO TABS: 500.0000 mg | ORAL_TABLET | Freq: Four times a day (QID) | ORAL | Status: DC | PRN

## 2016-03-11 MED ORDER — ACETAMINOPHEN 325 MG PO TABS
650.0000 mg | ORAL_TABLET | Freq: Four times a day (QID) | ORAL | Status: DC | PRN
Start: 2016-03-11 — End: 2016-03-12
  Administered 2016-03-12: 650 mg via ORAL
  Filled 2016-03-11: qty 2

## 2016-03-11 MED ORDER — BUPIVACAINE-EPINEPHRINE (PF) 0.5% -1:200000 IJ SOLN
INTRAMUSCULAR | Status: AC
  Filled 2016-03-11: qty 30

## 2016-03-11 MED ORDER — DIPHENHYDRAMINE HCL 12.5 MG/5ML PO ELIX: 12.5000 mg | ORAL_SOLUTION | ORAL | Status: DC | PRN

## 2016-03-11 MED ORDER — LACTATED RINGERS IV SOLN
INTRAVENOUS | Status: DC
  Administered 2016-03-11 (×3): via INTRAVENOUS

## 2016-03-11 MED ORDER — ACETAMINOPHEN 500 MG PO TABS
1000.0000 mg | ORAL_TABLET | Freq: Four times a day (QID) | ORAL | Status: DC
  Administered 2016-03-12 (×3): 1000 mg via ORAL
  Filled 2016-03-11 (×3): qty 2

## 2016-03-11 MED ORDER — FENTANYL CITRATE (PF) 100 MCG/2ML IJ SOLN
INTRAMUSCULAR | Status: AC
  Administered 2016-03-11: 50 ug via INTRAVENOUS
  Filled 2016-03-11: qty 2

## 2016-03-11 MED ORDER — OXYCODONE HCL 5 MG PO TABS
ORAL_TABLET | ORAL | Status: AC
  Administered 2016-03-11: 10 mg via ORAL
  Filled 2016-03-11: qty 2

## 2016-03-11 MED ORDER — HYDROMORPHONE HCL 1 MG/ML IJ SOLN: 1.0000 mg | INTRAMUSCULAR | Status: DC | PRN

## 2016-03-11 MED ORDER — POLYETHYLENE GLYCOL 3350 17 G PO PACK: 17.0000 g | PACK | Freq: Every day | ORAL | Status: DC | PRN

## 2016-03-11 MED ORDER — PHENYLEPHRINE HCL 10 MG/ML IJ SOLN
INTRAVENOUS | Status: DC | PRN
  Administered 2016-03-11: 25 ug/min via INTRAVENOUS

## 2016-03-11 MED ORDER — FLEET ENEMA 7-19 GM/118ML RE ENEM: 1.0000 | ENEMA | Freq: Once | RECTAL | Status: DC | PRN

## 2016-03-11 MED ORDER — BUPIVACAINE LIPOSOME 1.3 % IJ SUSP
20.0000 mL | Freq: Once | INTRAMUSCULAR | Status: AC
  Administered 2016-03-11: 20 mL
  Filled 2016-03-11 (×2): qty 20

## 2016-03-11 MED ORDER — FENTANYL CITRATE (PF) 100 MCG/2ML IJ SOLN
INTRAMUSCULAR | Status: AC
  Filled 2016-03-11: qty 4

## 2016-03-11 MED ORDER — ONDANSETRON HCL 4 MG/2ML IJ SOLN
INTRAMUSCULAR | Status: AC
  Filled 2016-03-11: qty 2

## 2016-03-11 MED ORDER — ONDANSETRON HCL 4 MG/2ML IJ SOLN
INTRAMUSCULAR | Status: DC | PRN
  Administered 2016-03-11: 4 mg via INTRAVENOUS

## 2016-03-11 MED ORDER — DEXTROSE-NACL 5-0.45 % IV SOLN: INTRAVENOUS | Status: DC

## 2016-03-11 MED ORDER — PROPOFOL 10 MG/ML IV BOLUS
INTRAVENOUS | Status: AC
  Filled 2016-03-11: qty 20

## 2016-03-11 MED ORDER — CELECOXIB 200 MG PO CAPS
200.0000 mg | ORAL_CAPSULE | Freq: Two times a day (BID) | ORAL | Status: DC
  Administered 2016-03-11 – 2016-03-12 (×2): 200 mg via ORAL
  Filled 2016-03-11 (×2): qty 1

## 2016-03-11 MED ORDER — ONDANSETRON HCL 4 MG PO TABS: 4.0000 mg | ORAL_TABLET | Freq: Four times a day (QID) | ORAL | Status: DC | PRN

## 2016-03-11 MED ORDER — BUPIVACAINE-EPINEPHRINE 0.25% -1:200000 IJ SOLN
INTRAMUSCULAR | Status: DC | PRN
  Administered 2016-03-11: 50 mL

## 2016-03-11 MED ORDER — DEXAMETHASONE SODIUM PHOSPHATE 10 MG/ML IJ SOLN
10.0000 mg | Freq: Once | INTRAMUSCULAR | Status: AC
  Administered 2016-03-12: 10 mg via INTRAVENOUS
  Filled 2016-03-11: qty 1

## 2016-03-11 MED ORDER — PHENOL 1.4 % MT LIQD: 1.0000 | OROMUCOSAL | Status: DC | PRN

## 2016-03-11 MED ORDER — ACETAMINOPHEN 650 MG RE SUPP: 650.0000 mg | Freq: Four times a day (QID) | RECTAL | Status: DC | PRN

## 2016-03-11 MED ORDER — MIDAZOLAM HCL 2 MG/2ML IJ SOLN
INTRAMUSCULAR | Status: AC
  Filled 2016-03-11: qty 2

## 2016-03-11 MED ORDER — ALUMINUM HYDROXIDE GEL 320 MG/5ML PO SUSP
15.0000 mL | ORAL | Status: DC | PRN
  Filled 2016-03-11: qty 30

## 2016-03-11 MED ORDER — PROPOFOL 10 MG/ML IV BOLUS
INTRAVENOUS | Status: DC | PRN
  Administered 2016-03-11: 30 mg via INTRAVENOUS

## 2016-03-11 MED ORDER — ASPIRIN EC 325 MG PO TBEC: 325.0000 mg | DELAYED_RELEASE_TABLET | Freq: Two times a day (BID) | ORAL | 0 refills | Status: DC

## 2016-03-11 MED ORDER — METOCLOPRAMIDE HCL 5 MG/ML IJ SOLN: 5.0000 mg | Freq: Three times a day (TID) | INTRAMUSCULAR | Status: DC | PRN

## 2016-03-11 MED ORDER — 0.9 % SODIUM CHLORIDE (POUR BTL) OPTIME
TOPICAL | Status: DC | PRN
  Administered 2016-03-11: 1000 mL

## 2016-03-11 MED ORDER — BUPIVACAINE HCL (PF) 0.25 % IJ SOLN
INTRAMUSCULAR | Status: AC
  Filled 2016-03-11: qty 30

## 2016-03-11 SURGICAL SUPPLY — 46 items
BLADE SURG ROTATE 9660 (MISCELLANEOUS) IMPLANT
CAPT HIP TOTAL 2 ×2 IMPLANT
COVER PERINEAL POST (MISCELLANEOUS) ×3 IMPLANT
COVER SURGICAL LIGHT HANDLE (MISCELLANEOUS) ×3 IMPLANT
DRAPE C-ARM 42X72 X-RAY (DRAPES) ×3 IMPLANT
DRAPE STERI IOBAN 125X83 (DRAPES) ×3 IMPLANT
DRAPE U-SHAPE 47X51 STRL (DRAPES) ×6 IMPLANT
DRSG AQUACEL AG ADV 3.5X10 (GAUZE/BANDAGES/DRESSINGS) ×3 IMPLANT
DURAPREP 26ML APPLICATOR (WOUND CARE) ×3 IMPLANT
ELECT BLADE 4.0 EZ CLEAN MEGAD (MISCELLANEOUS) ×3
ELECT REM PT RETURN 9FT ADLT (ELECTROSURGICAL) ×3
ELECTRODE BLDE 4.0 EZ CLN MEGD (MISCELLANEOUS) ×1 IMPLANT
ELECTRODE REM PT RTRN 9FT ADLT (ELECTROSURGICAL) ×1 IMPLANT
FACESHIELD WRAPAROUND (MASK) ×6 IMPLANT
FACESHIELD WRAPAROUND OR TEAM (MASK) ×2 IMPLANT
GLOVE BIO SURGEON STRL SZ7.5 (GLOVE) ×3 IMPLANT
GLOVE BIO SURGEON STRL SZ8.5 (GLOVE) ×3 IMPLANT
GLOVE BIOGEL PI IND STRL 8 (GLOVE) ×1 IMPLANT
GLOVE BIOGEL PI IND STRL 9 (GLOVE) ×1 IMPLANT
GLOVE BIOGEL PI INDICATOR 8 (GLOVE) ×2
GLOVE BIOGEL PI INDICATOR 9 (GLOVE) ×2
GOWN STRL REUS W/ TWL LRG LVL3 (GOWN DISPOSABLE) ×1 IMPLANT
GOWN STRL REUS W/ TWL XL LVL3 (GOWN DISPOSABLE) ×2 IMPLANT
GOWN STRL REUS W/TWL LRG LVL3 (GOWN DISPOSABLE) ×3
GOWN STRL REUS W/TWL XL LVL3 (GOWN DISPOSABLE) ×6
KIT BASIN OR (CUSTOM PROCEDURE TRAY) ×3 IMPLANT
KIT ROOM TURNOVER OR (KITS) ×3 IMPLANT
MANIFOLD NEPTUNE II (INSTRUMENTS) ×3 IMPLANT
NDL 22X1.5 STRL (OR ONLY) (MISCELLANEOUS) ×2 IMPLANT
NEEDLE 22X1 1/2 OR ONLY (MISCELLANEOUS) ×4
NEEDLE 22X1.5 STRL (OR ONLY) (MISCELLANEOUS) ×2 IMPLANT
NS IRRIG 1000ML POUR BTL (IV SOLUTION) ×3 IMPLANT
PACK TOTAL JOINT (CUSTOM PROCEDURE TRAY) ×3 IMPLANT
PAD ARMBOARD 7.5X6 YLW CONV (MISCELLANEOUS) ×6 IMPLANT
SAW OSC TIP CART 19.5X105X1.3 (SAW) ×3 IMPLANT
SUT VIC AB 1 CTX 36 (SUTURE) ×3
SUT VIC AB 1 CTX36XBRD ANBCTR (SUTURE) ×1 IMPLANT
SUT VIC AB 2-0 CT1 27 (SUTURE) ×6
SUT VIC AB 2-0 CT1 TAPERPNT 27 (SUTURE) ×2 IMPLANT
SUT VIC AB 3-0 PS2 18 (SUTURE) ×3
SUT VIC AB 3-0 PS2 18XBRD (SUTURE) ×1 IMPLANT
SYR CONTROL 10ML LL (SYRINGE) ×6 IMPLANT
TOWEL OR 17X24 6PK STRL BLUE (TOWEL DISPOSABLE) ×3 IMPLANT
TOWEL OR 17X26 10 PK STRL BLUE (TOWEL DISPOSABLE) ×3 IMPLANT
TRAY FOLEY CATH 14FR (SET/KITS/TRAYS/PACK) IMPLANT
WATER STERILE IRR 1000ML POUR (IV SOLUTION) ×3 IMPLANT

## 2016-03-11 NOTE — Anesthesia Procedure Notes (Signed)
Spinal  Patient location during procedure: OR Start time: 03/11/2016 10:30 AM End time: 03/11/2016 10:33 AM Staffing Performed: anesthesiologist  Preanesthetic Checklist Completed: patient identified, site marked, surgical consent, pre-op evaluation, timeout performed, IV checked, risks and benefits discussed and monitors and equipment checked Spinal Block Patient position: sitting Prep: Betadine Patient monitoring: heart rate, cardiac monitor, continuous pulse ox and blood pressure Approach: midline Location: L3-4 Injection technique: single-shot Needle Needle type: Quincke  Needle gauge: 22 G Needle length: 9 cm Needle insertion depth: 4 cm Assessment Sensory level: T6 Additional Notes 10 mg 0.75% Bupivacaine injected easily

## 2016-03-11 NOTE — Anesthesia Postprocedure Evaluation (Signed)
Anesthesia Post Note  Patient: Brianna Petterson  Procedure(s) Performed: Procedure(s) (LRB): LEFT TOTAL HIP ARTHROPLASTY ANTERIOR APPROACH (Left)  Patient location during evaluation: PACU Anesthesia Type: MAC and Spinal Level of consciousness: awake, oriented and awake and alert Pain management: pain level controlled Respiratory status: spontaneous breathing, respiratory function stable, nonlabored ventilation and patient connected to nasal cannula oxygen Cardiovascular status: blood pressure returned to baseline Postop Assessment: spinal receding Anesthetic complications: no    Last Vitals:  Vitals:   03/11/16 1355 03/11/16 1400  BP: (!) 152/75   Pulse:  (!) 57  Resp:  15  Temp:      Last Pain:  Vitals:   03/11/16 0823  TempSrc: Oral                 Aren Pryde COKER

## 2016-03-11 NOTE — Discharge Instructions (Signed)

## 2016-03-11 NOTE — Op Note (Addendum)
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 [employer]  (present throughout entire procedure and necessary for timely completion of the procedure)   ANESTHESIA: Spinal BLOOD LOSS: 300 FLUID REPLACEMENT: 1600 crystalloid Antibiotic: 2gm ancef Tranexamic Acid: 1gm iv, 2gm topical COMPLICATIONS: none    INDICATIONS FOR PROCEDURE: A 65 y.o. 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 Despite conservative measures with observation, anti-inflammatory medicine, narcotics, use of a cane, has severe unremitting pain and can ambulate only a few blocks before resting. Patient desires elective L total hip arthroplasty to decrease pain and increase function. The risks, benefits, and alternatives were discussed at length including but not limited to the risks of infection, bleeding, nerve injury, stiffness, blood clots, the need for revision surgery, cardiopulmonary complications, among others, and they were willing to proceed. Questions answered     PROCEDURE IN DETAIL: The patient was identified by armband,  received preoperative IV antibiotics in the holding area at [facility], taken to the operating room , appropriate anesthetic monitors  were attached and  anesthesia was induced with the patienton the gurney. The  HANA boots were applied to the feet and he was then transferred to the HANA table with a peroneal post and support underneath the non-operative le, which was locked in 5 lb traction. Theoperative lower extremity was then prepped and draped in the usual sterile fashion from just above the iliac crest to the knee. And a timeout procedure was performed. We then made a 10 cm incision along the interval at the leading edge of the tensor fascia lata of starting at 2 cm lateral to and 2 cm distal to the ASIS. Small bleeders in the skin and subcutaneous tissue identified and cauterized we dissected down to the fascia and made an incision in the fascia allowing [place] to elevate the fascia of the tensor muscle and exploited the interval between the rectus and the tensor fascia lata. A Hohmann retractor was then placed along the superior neck of the femur and a Cobra retractor along the inferior neck of the femur we teed the capsule starting out at the superior anterior aspect of the acetabulum going distally and made the T along the neck both leaflets of the T were tagged with #2 Ethibond suture.  Cobra retractors were then placed along the inferior and superior neck allowing us to perform a standard neck cut and removed the femoral head with a power corkscrew. We then placed a right angle Hohmann retractor along the anterior aspect of the acetabulum a spiked Cobra in the cotyloid notch and posteriorly a Muelller retractor. We then sequentially reamed up to a 47 mm basket reamer obtaining good coverage in all quadrants, verified by C-arm imaging. Under C-arm control with and hammered into place a 48 mm Pinnacle cup in 45 of abduction and 15 of anteversion. The cup seated nicely and required no supplemental screws. We then placed a central hole Eliminator and a 0 polyethylene liner. The foot was then externally rotated to 110, the HANA elevator was placed around the flare of the greater trochanter and the limb was extended and  abducted delivering the proximal femur up into the wound. A medium Hohmann retractor was placed over the greater trochanter and a Mueller retractor along the posterior femoral neck completing the exposure. We then performed releases superiorly and and inferiorly of the capsule going back to the pirformis fossa superiorly and to the lesser trochanter inferiorly. We then entered the proximal femur with the box cutting offset chisel followed by, a canal sounder, the chili pepper and broaching up to a 4 broach. This seated nicely and we reamed the calcar. A trial reduction was performed with a 1.5 mm 32 mm head.The limb lengths were excellent the hip was stable in 90 of external rotation. At this point the trial components removed and we hammered into place a # 4 Tri-Lock stem with Gryption coating. This was a hi offset stem and a + 1.185mm 32 mm ceramic ball was then hammered into place the hip was reduced and final C-arm images obtained. The wound was thoroughly irrigated with normal saline solution. We repaired the ant capsule and the tensor fascia lot a with running 0 vicryl suture. the subcutaneous tissue was closed with 2-0 and 3-0 Vicryl suture followed by an Aquacil dressing. At this point the patient was awaken and transferred to hospital gurney without difficulty. The subcutaneous tissue with 0 and 2-0 undyed Vicryl suture and the skin with running  3-0 vicryl subcuticular suture. Aquacil dressing was applied. The patient was then unclamped, rolled supine, awaken extubated. At this point.  We did inject the left knee with a mixture of3 cc of betamethasone and 6 cc of half percent Marcaine with epinephrine solution.  The skin along the peripatellar margin was sterilely prepped with alcohol and using a 22-gauge by 1/2 inch needle with a 10 cc syringe.  This mixture was injected into the knee without difficulty.  While the patient was under spinal anesthesia.  She tolerated the procedure well. She wwas taken to  recovery room without difficulty in stable condition.     Wang Granada J 03/11/2016, 11:52 AM

## 2016-03-11 NOTE — Interval H&P Note (Signed)
No Inerval change since H&P

## 2016-03-11 NOTE — Transfer of Care (Signed)
Immediate Anesthesia Transfer of Care Note  Patient: Katrina Gordon  Procedure(s) Performed: Procedure(s): LEFT TOTAL HIP ARTHROPLASTY ANTERIOR APPROACH (Left)  Patient Location: PACU  Anesthesia Type:MAC and Spinal  Level of Consciousness: awake, alert  and oriented  Airway & Oxygen Therapy: Patient Spontanous Breathing and Patient connected to nasal cannula oxygen  Post-op Assessment: Report given to RN, Post -op Vital signs reviewed and stable and Patient moving all extremities X 4  Post vital signs: Reviewed and stable  Last Vitals:  Vitals:   03/11/16 0823 03/11/16 1240  BP: (!) 177/83 111/70  Pulse: 67 (!) 56  Resp: 20 17  Temp: 37 C 36.1 C    Last Pain:  Vitals:   03/11/16 0823  TempSrc: Oral         Complications: No apparent anesthesia complications

## 2016-03-11 NOTE — Anesthesia Preprocedure Evaluation (Addendum)
Anesthesia Evaluation  Patient identified by MRN, date of birth, ID band Patient awake    Reviewed: Allergy & Precautions, NPO status , Patient's Chart, lab work & pertinent test results  Airway Mallampati: II  TM Distance: >3 FB Neck ROM: Full    Dental  (+) Teeth Intact, Dental Advisory Given   Pulmonary    breath sounds clear to auscultation       Cardiovascular  Rhythm:Regular Rate:Normal     Neuro/Psych    GI/Hepatic   Endo/Other    Renal/GU      Musculoskeletal   Abdominal   Peds  Hematology   Anesthesia Other Findings   Reproductive/Obstetrics                            Anesthesia Physical Anesthesia Plan  ASA: II  Anesthesia Plan: MAC and Spinal   Post-op Pain Management:    Induction: Intravenous  Airway Management Planned: Nasal Cannula and Natural Airway  Additional Equipment:   Intra-op Plan:   Post-operative Plan:   Informed Consent: I have reviewed the patients History and Physical, chart, labs and discussed the procedure including the risks, benefits and alternatives for the proposed anesthesia with the patient or authorized representative who has indicated his/her understanding and acceptance.   Dental advisory given  Plan Discussed with: CRNA and Anesthesiologist  Anesthesia Plan Comments:         Anesthesia Quick Evaluation

## 2016-03-11 NOTE — Evaluation (Signed)
Physical Therapy Evaluation Patient Details Name: Katrina Gordon MRN: [id number] DOB: [birth year] Today's Date: 03/11/2016   History of Present Illness  Pt is a 65 y.o. female now s/p Lt direct anterior THA. PMH:  THA 2003.   Clinical Impression  Pt is s/p direct anterior THA resulting in the deficits listed below (see PT Problem List). Pt able to ambulate 12 ft with rw and min guard assist. Distance limited by pt reports of fatigue and c/o feeling lightheaded. Pt will benefit from skilled PT to increase their independence and safety with mobility to allow discharge to home with family support.      Follow Up Recommendations Home health PT;Supervision for mobility/OOB    Equipment Recommendations  Rolling walker with 5" wheels    Recommendations for Other Services       Precautions / Restrictions Precautions Precautions: Fall Precaution Comments: HEP provided Restrictions Weight Bearing Restrictions: Yes LLE Weight Bearing: Weight bearing as tolerated      Mobility  Bed Mobility Overal bed mobility: Needs Assistance Bed Mobility: Supine to Sit     Supine to sit: Min assist;HOB elevated     General bed mobility comments: assist provided with LLE  Transfers Overall transfer level: Needs assistance   Transfers: Sit to/from Stand Sit to Stand: Min guard         General transfer comment: cues for hand placement  Ambulation/Gait Ambulation/Gait assistance: Min guard Ambulation Distance (Feet): 12 Feet Assistive device: Rolling walker (2 wheeled) Gait Pattern/deviations: Step-to pattern;Decreased weight shift to left;Decreased stance time - left Gait velocity: decreased   General Gait Details: Pt reports feeling dizzy toward end of ambulation. Once sitting, pt reclined. Pt feeling much better after approx. 2 minutes.  Stairs            Wheelchair Mobility    Modified Rankin (Stroke Patients Only)       Balance Overall balance assessment: Needs  assistance Sitting-balance support: No upper extremity supported Sitting balance-Leahy Scale: Good     Standing balance support: Bilateral upper extremity supported Standing balance-Leahy Scale: Poor Standing balance comment: using rw                             Pertinent Vitals/Pain Pain Assessment: 0-10 Pain Score: 7  Pain Location: Lt hip Pain Descriptors / Indicators: Aching Pain Intervention(s): Limited activity within patient's tolerance;Monitored during session;Ice applied    Home Living Family/patient expects to be discharged to:: Private residence Living Arrangements: Alone Available Help at Discharge: Family;Available 24 hours/day (daughter) Type of Home: House Home Access: Level entry     Home Layout: Two level;Able to live on main level with bedroom/bathroom Home Equipment: None      Prior Function Level of Independence: Independent               Hand Dominance        Extremity/Trunk Assessment   Upper Extremity Assessment: Overall WFL for tasks assessed           Lower Extremity Assessment: LLE deficits/detail   LLE Deficits / Details: min assist needed with moving and lifting LE     Communication   Communication: No difficulties  Cognition Arousal/Alertness: Awake/alert Behavior During Therapy: WFL for tasks assessed/performed Overall Cognitive Status: Within Functional Limits for tasks assessed                      General Comments      Exercises  Total Joint Exercises Ankle Circles/Pumps: AROM;Both;15 reps   Assessment/Plan    PT Assessment Patient needs continued PT services  PT Problem List Decreased strength;Decreased range of motion;Decreased activity tolerance;Decreased balance;Decreased mobility          PT Treatment Interventions DME instruction;Gait training;Stair training;Functional mobility training;Therapeutic activities;Therapeutic exercise;Balance training;Patient/family education    PT  Goals (Current goals can be found in the Care Plan section)  Acute Rehab PT Goals Patient Stated Goal: go home from the hospital PT Goal Formulation: With patient Time For Goal Achievement: 03/25/16 Potential to Achieve Goals: Good    Frequency 7X/week   Barriers to discharge        Co-evaluation               End of Session Equipment Utilized During Treatment: Gait belt Activity Tolerance: Patient limited by fatigue (feeling lightheaded) Patient left: in chair;with family/visitor present;with call bell/phone within reach Nurse Communication: Mobility status;Weight bearing status         Time: [id number] PT Time Calculation (min) (ACUTE ONLY): 24 min   Charges:   PT Evaluation $PT Eval Moderate Complexity: 1 Procedure PT Treatments $Gait Training: 8-22 mins   PT G Codes:        Christiane HaBenjamin J. Rakesha Dalporto, PT, CSCS Pager [phone] Office [phone]  03/11/2016, 4:34 PM

## 2016-03-12 ENCOUNTER — Encounter (HOSPITAL_COMMUNITY): Payer: Self-pay

## 2016-03-12 LAB — BASIC METABOLIC PANEL
Anion gap: 9 (ref 5–15)
BUN: 11 mg/dL (ref 6–20)
CHLORIDE: 104 mmol/L (ref 101–111)
CO2: 24 mmol/L (ref 22–32)
CREATININE: 0.89 mg/dL (ref 0.44–1.00)
Calcium: 9.6 mg/dL (ref 8.9–10.3)
Glucose, Bld: 157 mg/dL — ABNORMAL HIGH (ref 65–99)
POTASSIUM: 5 mmol/L (ref 3.5–5.1)
SODIUM: 137 mmol/L (ref 135–145)

## 2016-03-12 LAB — CBC
HCT: 39.7 % (ref 36.0–46.0)
HEMOGLOBIN: 13.1 g/dL (ref 12.0–15.0)
MCH: 30 pg (ref 26.0–34.0)
MCHC: 33 g/dL (ref 30.0–36.0)
MCV: 91.1 fL (ref 78.0–100.0)
PLATELETS: 236 10*3/uL (ref 150–400)
RBC: 4.36 MIL/uL (ref 3.87–5.11)
RDW: 12.4 % (ref 11.5–15.5)
WBC: 11 10*3/uL — ABNORMAL HIGH (ref 4.0–10.5)

## 2016-03-12 NOTE — Evaluation (Signed)
Occupational Therapy Evaluation and Discharge Patient Details Name: Katrina SmackDarlene Roggenkamp MRN: [id number] DOB: 05/10/1951 Today's Date: 03/12/2016    History of Present Illness Pt is a 65 y.o. female now s/p Lt direct anterior THA. PMH:  THA 2003.    Clinical Impression   PTA pt independent in ADL and mobility. Pt currently supervision/mod I for ADL and supervision for mobility. Pt with deficit list below. Pt received all assessment and education and has no further OT needs at this time.      Follow Up Recommendations  No OT follow up;Supervision - Intermittent    Equipment Recommendations  None recommended by OT (Pt stated that she purchased 3 in 1)    Recommendations for Other Services       Precautions / Restrictions Precautions Precautions: Fall Precaution Comments: HEP provided Restrictions Weight Bearing Restrictions: Yes LLE Weight Bearing: Weight bearing as tolerated      Mobility Bed Mobility Overal bed mobility: Needs Assistance Bed Mobility: Supine to Sit;Sit to Supine     Supine to sit: Supervision;HOB elevated Sit to supine: Supervision;HOB elevated   General bed mobility comments: no physical assist with LLE  Transfers Overall transfer level: Needs assistance Equipment used: Rolling walker (2 wheeled) Transfers: Sit to/from Stand Sit to Stand: Supervision         General transfer comment: no cues for hand placement needed    Balance Overall balance assessment: Needs assistance Sitting-balance support: No upper extremity supported;Feet supported Sitting balance-Leahy Scale: Good     Standing balance support: No upper extremity supported;During functional activity Standing balance-Leahy Scale: Fair                              ADL Overall ADL's : Needs assistance/impaired Eating/Feeding: Modified independent   Grooming: Wash/dry hands;Wash/dry face;Modified independent;Standing Grooming Details (indicate cue type and reason):  sink level, no issues with light headedness Upper Body Bathing: Modified independent;Sitting   Lower Body Bathing: Supervison/ safety;Sit to/from stand Lower Body Bathing Details (indicate cue type and reason): educated in long handle sponge Upper Body Dressing : Modified independent;Sitting   Lower Body Dressing: Supervision/safety;Sit to/from stand Lower Body Dressing Details (indicate cue type and reason): Pt fully dressed when OT entered room, Per pt report she dressed by herself. Pt was able to demonstrate doff/don of hopsital socks. Toilet Transfer: Modified Independent;Ambulation;BSC (3 in 1 over toilet)   Toileting- Clothing Manipulation and Hygiene: Modified independent;Sit to/from stand Toileting - Clothing Manipulation Details (indicate cue type and reason): managed elastic pants and underwear as well as peri care mod I Tub/ Shower Transfer: Walk-in shower;Ambulation;Rolling walker Tub/Shower Transfer Details (indicate cue type and reason): educated in use of 3 in 1 as shower chair if necessary Functional mobility during ADLs: Rolling walker;Supervision/safety General ADL Comments: Pt very motivated to regain independence     Advice workerVision     Perception     Praxis      Pertinent Vitals/Pain Pain Assessment: 0-10 Pain Score: 3  Pain Location: Left Hip Pain Descriptors / Indicators: Aching Pain Intervention(s): Monitored during session;Ice applied     Hand Dominance Right   Extremity/Trunk Assessment Upper Extremity Assessment Upper Extremity Assessment: Overall WFL for tasks assessed   Lower Extremity Assessment Lower Extremity Assessment: Defer to PT evaluation   Cervical / Trunk Assessment Cervical / Trunk Assessment: Normal   Communication Communication Communication: No difficulties   Cognition Arousal/Alertness: Awake/alert Behavior During Therapy: WFL for tasks assessed/performed Overall Cognitive  Status: Within Functional Limits for tasks assessed                      General Comments       Exercises       Shoulder Instructions      Home Living Family/patient expects to be discharged to:: Private residence Living Arrangements: Alone Available Help at Discharge: Family;Available 24 hours/day Type of Home: House Home Access: Level entry     Home Layout: Two level;Able to live on main level with bedroom/bathroom     Bathroom Shower/Tub: Producer, television/film/video: Handicapped height Bathroom Accessibility: Yes How Accessible: Accessible via walker Home Equipment: Shower seat - built in;Bedside commode   Additional Comments: Pt is very active (walks 5-6 miles a day, and does eliptical)      Prior Functioning/Environment Level of Independence: Independent                 OT Problem List: Decreased range of motion;Decreased activity tolerance   OT Treatment/Interventions:      OT Goals(Current goals can be found in the care plan section) Acute Rehab OT Goals Patient Stated Goal: go home from the hospital OT Goal Formulation: All assessment and education complete, DC therapy Time For Goal Achievement: 03/19/16 Potential to Achieve Goals: Good  OT Frequency:     Barriers to D/C:            Co-evaluation              End of Session Equipment Utilized During Treatment: Rolling walker Nurse Communication: Mobility status  Activity Tolerance: Patient tolerated treatment well Patient left: in bed;with call bell/phone within reach;Other (comment) (with housekeeping in room)   Time: 5409-8119 OT Time Calculation (min): 18 min Charges:  OT General Charges $OT Visit: 1 Procedure OT Evaluation $OT Eval Low Complexity: 1 Procedure G-Codes:    Evern Bio Palmer Shorey 03-25-16, 11:54 AM  Sherryl Manges OTR/L [phone]

## 2016-03-12 NOTE — Progress Notes (Signed)
PATIENT ID: Katrina Gordon  MRN: [id number]  DOB/AGE:  67/11/1950 / 65 y.o.  1 Day Post-Op Procedure(s) (LRB): LEFT TOTAL HIP ARTHROPLASTY ANTERIOR APPROACH (Left)    PROGRESS NOTE Subjective: Patient is alert, oriented, no Nausea, no Vomiting, yes passing gas, . Taking PO well. Denies SOB, Chest or Calf Pain. Using Incentive Spirometer, PAS in place. Ambulate WBAT Patient reports pain as  2/10  .    Objective: Vital signs in last 24 hours: Vitals:   03/11/16 1415 03/11/16 2032 03/12/16 0012 03/12/16 0408  BP:  136/68 (!) 111/55 111/60  Pulse: 68 75 75 73  Resp: 13 16 16 16   Temp:  97.9 F (36.6 C) 98.7 F (37.1 C) 98.1 F (36.7 C)  TempSrc:  Oral Oral Oral  SpO2: 99% 95% 96% 95%  Weight:      Height:          Intake/Output from previous day: I/O last 3 completed shifts: In: 1300 [I.V.:1300] Out: 700 [Urine:400; Blood:300]   Intake/Output this shift: No intake/output data recorded.   LABORATORY DATA:  Recent Labs  03/12/16 0504  WBC 11.0*  HGB 13.1  HCT 39.7  PLT 236  NA 137  K 5.0  CL 104  CO2 24  BUN 11  CREATININE 0.89  GLUCOSE 157*  CALCIUM 9.6    Examination: Neurologically intact ABD soft Neurovascular intact Sensation intact distally Intact pulses distally Dorsiflexion/Plantar flexion intact Incision: dressing C/D/I No cellulitis present Compartment soft} XR AP&Lat of hip shows well placed\fixed THA  Assessment:   1 Day Post-Op Procedure(s) (LRB): LEFT TOTAL HIP ARTHROPLASTY ANTERIOR APPROACH (Left) ADDITIONAL DIAGNOSIS:  Expected Acute Blood Loss Anemia,   Plan: PT/OT WBAT, THA  DVT Prophylaxis: SCDx72 hrs, ASA 325 mg BID x 2 weeks  DISCHARGE PLAN: Home  DISCHARGE NEEDS: HHPT, Walker and 3-in-1 comode seat

## 2016-03-12 NOTE — Discharge Summary (Signed)
Patient ID: Katrina Gordon MRN: [id number] DOB/AGE: 08/26/1950 65 y.o.  Admit date: 03/11/2016 Discharge date: 03/12/2016  Admission Diagnoses:  Principal Problem:   Primary osteoarthritis of left hip Active Problems:   Primary localized osteoarthritis of left hip   Discharge Diagnoses:  Same  Past Medical History:  Diagnosis Date  . Arthritis   . History of bronchitis 1 yr ago  . Joint pain   . Joint swelling   . Pneumonia 6 wks ago    Surgeries: Procedure(s): LEFT TOTAL HIP ARTHROPLASTY ANTERIOR APPROACH on 03/11/2016   Consultants:   Discharged Condition: Improved  Hospital Course: Katrina Gordon is an 65 y.o. female who was admitted 03/11/2016 for operative treatment ofPrimary osteoarthritis of left hip. Patient has severe unremitting pain that affects sleep, daily activities, and work/hobbies. After pre-op clearance the patient was taken to the operating room on 03/11/2016 and underwent  Procedure(s): LEFT TOTAL HIP ARTHROPLASTY ANTERIOR APPROACH.    Patient was given perioperative antibiotics: Anti-infectives    Start     Dose/Rate Route Frequency Ordered Stop   03/11/16 0600  ceFAZolin (ANCEF) IVPB 2g/100 mL premix     2 g 200 mL/hr over 30 Minutes Intravenous On call to O.R. 03/10/16 1429 03/11/16 1047       Patient was given sequential compression devices, early ambulation, and chemoprophylaxis to prevent DVT.  Patient benefited maximally from hospital stay and there were no complications.    Recent vital signs: Patient Vitals for the past 24 hrs:  BP Temp Temp src Pulse Resp SpO2  03/12/16 1231 (!) 146/73 98 F (36.7 C) Oral 71 16 95 %  03/12/16 0408 111/60 98.1 F (36.7 C) Oral 73 16 95 %  03/12/16 0012 (!) 111/55 98.7 F (37.1 C) Oral 75 16 96 %  03/11/16 2032 136/68 97.9 F (36.6 C) Oral 75 16 95 %     Recent laboratory studies:  Recent Labs  03/12/16 0504  WBC 11.0*  HGB 13.1  HCT 39.7  PLT 236  NA 137  K 5.0  CL 104  CO2 24  BUN 11   CREATININE 0.89  GLUCOSE 157*  CALCIUM 9.6     Discharge Medications:     Medication List    STOP taking these medications   meloxicam 7.5 MG tablet Commonly known as:  MOBIC     TAKE these medications   acetaminophen 500 MG tablet Commonly known as:  TYLENOL Take 1,000 mg by mouth every 6 (six) hours as needed for mild pain.   aspirin EC 325 MG tablet Take 1 tablet (325 mg total) by mouth 2 (two) times daily.   CALCIUM + D PO Take 1 tablet by mouth daily. Reported on 08/15/2015   cholecalciferol 1000 units tablet Commonly known as:  VITAMIN D Take 1,000 Units by mouth daily.   methocarbamol 500 MG tablet Commonly known as:  ROBAXIN Take 1 tablet (500 mg total) by mouth 2 (two) times daily with a meal.   nystatin-triamcinolone cream Commonly known as:  MYCOLOG II APPLY TO AFFECTED AREA TWICE A DAY   oxyCODONE-acetaminophen 5-325 MG tablet Commonly known as:  ROXICET Take 1 tablet by mouth every 4 (four) hours as needed.   PREMPRO 0.3-1.5 MG tablet Generic drug:  estrogen (conjugated)-medroxyprogesterone take 1 tablet by mouth once daily   PRESERVISION AREDS PO Take 1 capsule by mouth daily.   TART CHERRY ADVANCED PO Take 2 capsules by mouth daily.   GLUCOSAMINE CHOND DOUBLE STR PO Take 2 tablets by mouth  daily.   TURMERIC PO Take 2 tablets by mouth daily.   VITAMIN C PO Take 1 tablet by mouth daily.       Diagnostic Studies: Dg Chest 2 View  Result Date: 03/01/2016 CLINICAL DATA:  Preoperative evaluation for hip surgery. EXAM: CHEST  2 VIEW COMPARISON:  None. FINDINGS: There is minimal scarring in the left base. Lungs elsewhere clear. Heart size and pulmonary vascularity are normal. No adenopathy. There is atherosclerotic calcification in the aorta. IMPRESSION: Minimal scarring left base. No edema or consolidation. Aortic atherosclerosis. Electronically Signed   By: Bretta BangWilliam  Woodruff III M.D.   On: 03/01/2016 15:57   Dg C-arm 1-60 Min  Result  Date: 03/11/2016 CLINICAL DATA:  Status post anterior approach left total hip arthroplasty. Intraoperative fluoro spot images. Fluoro time reported is 20 seconds. EXAM: OPERATIVE left HIP (WITH PELVIS IF PERFORMED) 2 VIEWS TECHNIQUE: Fluoroscopic spot image(s) were submitted for interpretation post-operatively. COMPARISON:  None. FINDINGS: Two AP views of the left hemipelvis reveal appropriate positioning of the prosthetic components of the hip. The interface with the native bone appears normal. No native bone fracture is observed. IMPRESSION: No evidence of immediate complication following anterior approach left total hip joint replacement. Electronically Signed   By: David  SwazilandJordan M.D.   On: 03/11/2016 11:54   Dg Hip Operative Unilat With Pelvis Left  Result Date: 03/11/2016 CLINICAL DATA:  Status post anterior approach left total hip arthroplasty. Intraoperative fluoro spot images. Fluoro time reported is 20 seconds. EXAM: OPERATIVE left HIP (WITH PELVIS IF PERFORMED) 2 VIEWS TECHNIQUE: Fluoroscopic spot image(s) were submitted for interpretation post-operatively. COMPARISON:  None. FINDINGS: Two AP views of the left hemipelvis reveal appropriate positioning of the prosthetic components of the hip. The interface with the native bone appears normal. No native bone fracture is observed. IMPRESSION: No evidence of immediate complication following anterior approach left total hip joint replacement. Electronically Signed   By: David  [place]azilandJordan M.D.   On: 03/11/2016 11:54    Disposition:   Discharge Instructions    Call MD / Call 911    Complete by:  As directed    If you experience chest pain or shortness of breath, CALL 911 and be transported to the hospital emergency room.  If you develope a fever above 101 F, pus (white drainage) or increased drainage or redness at the wound, or calf pain, call your surgeon's office.   Constipation Prevention    Complete by:  As directed    Drink plenty of fluids.   Prune juice may be helpful.  You may use a stool softener, such as Colace (over the counter) 100 mg twice a day.  Use MiraLax (over the counter) for constipation as needed.   Diet - low sodium heart healthy    Complete by:  As directed    Driving restrictions    Complete by:  As directed    No driving for 2 weeks   Follow the hip precautions as taught in Physical Therapy    Complete by:  As directed    Increase activity slowly as tolerated    Complete by:  As directed    Patient may shower    Complete by:  As directed    You may shower without a dressing once there is no drainage.  Do not wash over the wound.  If drainage remains, cover wound with plastic wrap and then shower.      Follow-up Information    Nestor LewandowskyOWAN,FRANK J, MD.   Specialty:  Orthopedic Surgery Contact information: [address] [place] [address] [phone]        [facility] .   Why:  Someone from [facility] will contact you to arrange start date and time for therapy Contact information: [address] [place] [address] [phone]            Signed: Henry Russel 03/12/2016, 3:13 PM

## 2016-03-12 NOTE — Progress Notes (Signed)
Physical Therapy Treatment Patient Details Name: Katrina Gordon MRN: [id number] DOB: [birth year] Today's Date: 03/12/2016    History of Present Illness Pt is a 65 y.o. female now s/p Lt direct anterior THA. PMH:  THA 2003.     PT Comments    Pt performed increased gait, reviewed curb training, and reviewed HEP in prep for d/c home.  Pt is now ready to d/c home.    Follow Up Recommendations  Home health PT;Supervision for mobility/OOB     Equipment Recommendations  Rolling walker with 5" wheels    Recommendations for Other Services       Precautions / Restrictions Precautions Precautions: Fall Precaution Comments: HEP provided Restrictions Weight Bearing Restrictions: Yes LLE Weight Bearing: Weight bearing as tolerated    Mobility  Bed Mobility Overal bed mobility: Needs Assistance Bed Mobility: Supine to Sit;Sit to Supine     Supine to sit: Modified independent (Device/Increase time) Sit to supine: Modified independent (Device/Increase time)   General bed mobility comments: Good technique no cues required  Transfers Overall transfer level: Modified independent Equipment used: Rolling walker (2 wheeled) Transfers: Sit to/from Stand Sit to Stand: Modified independent (Device/Increase time)         General transfer comment: Good technique no cues needed.    Ambulation/Gait Ambulation/Gait assistance: Modified independent (Device/Increase time) Ambulation Distance (Feet): 216 Feet Assistive device: Rolling walker (2 wheeled) Gait Pattern/deviations: Step-through pattern;Trunk flexed;Decreased stride length Gait velocity: decreased   General Gait Details: Cues for gait symmetry to correct gait deviations.  Pt is mod I as far as safety is concerned.     Stairs Stairs: Yes Stairs assistance: Supervision Stair Management: No rails;With walker;Forwards;Step to pattern Number of Stairs: 2 General stair comments: Pt performed curb negotiation x2 with cues for  sequencing and RW placement.    Wheelchair Mobility    Modified Rankin (Stroke Patients Only)       Balance     Sitting balance-Leahy Scale: Good       Standing balance-Leahy Scale: Fair Standing balance comment: using RW                    Cognition Arousal/Alertness: Awake/alert Behavior During Therapy: WFL for tasks assessed/performed Overall Cognitive Status: Within Functional Limits for tasks assessed                      Exercises Total Joint Exercises Ankle Circles/Pumps: AROM;Both;15 reps Quad Sets: AROM;Left;10 reps;Supine Short Arc Quad: AROM;Left;10 reps;Supine Heel Slides: AROM;Left;10 reps;Supine Hip ABduction/ADduction: AROM;Left;20 reps;Supine;Standing (1x10 standing and 1x10 supine) Long Arc Quad: AROM;Left;10 reps;Seated Knee Flexion: AROM;Left;10 reps;Standing Marching in Standing: AROM;Left;10 reps;Standing Standing Hip Extension: AROM;Left;10 reps;Standing Other Exercises Other Exercises: HC/HS stretch 2x20 sec with gait belt.  Quad stretch 3x30 sec with gait belt.      General Comments        Pertinent Vitals/Pain Pain Assessment: 0-10 Pain Score: 3  Pain Location: L hip Pain Descriptors / Indicators: Tightness;Sore Pain Intervention(s): Monitored during session;Repositioned;Ice applied    Home Living                      Prior Function            PT Goals (current goals can now be found in the care plan section) Acute Rehab PT Goals Patient Stated Goal: go home from the hospital Potential to Achieve Goals: Good Progress towards PT goals: Progressing toward goals    Frequency  7X/week      PT Plan Current plan remains appropriate    Co-evaluation             End of Session Equipment Utilized During Treatment: Gait belt Activity Tolerance: Patient tolerated treatment well Patient left: with call bell/phone within reach;in bed     Time: [id number] PT Time Calculation (min) (ACUTE ONLY):  28 min  Charges:  $Gait Training: 8-22 mins $Therapeutic Exercise: 8-22 mins                    G Codes:      Florestine Aversimee J Takyra Cantrall 03/12/2016, 3:05 PM  Joycelyn RuaAimee Pahola Dimmitt, PTA pager [phone]

## 2016-03-12 NOTE — Progress Notes (Signed)
Physical Therapy Treatment Patient Details Name: Katrina Gordon MRN: [id number] DOB: [birth year] Today's Date: 03/12/2016    History of Present Illness Pt is a 65 y.o. female now s/p Lt direct anterior THA. PMH:  THA 2003.     PT Comments    Pt performed increased gait, reviewed stair training and HEP in prep for d/c home.  Will f/u this pm for curb training and therapeutic exercise before d/c.    Follow Up Recommendations  Home health PT;Supervision for mobility/OOB     Equipment Recommendations  Rolling walker with 5" wheels    Recommendations for Other Services       Precautions / Restrictions Precautions Precautions: Fall Precaution Comments: HEP provided Restrictions Weight Bearing Restrictions: Yes LLE Weight Bearing: Weight bearing as tolerated    Mobility  Bed Mobility Overal bed mobility: Needs Assistance Bed Mobility: Supine to Sit     Supine to sit: Min assist;HOB elevated        Transfers Overall transfer level: Needs assistance Equipment used: Rolling walker (2 wheeled) Transfers: Sit to/from Stand Sit to Stand: Supervision         General transfer comment: cues for hand placement  Ambulation/Gait Ambulation/Gait assistance: Supervision Ambulation Distance (Feet): 100 Feet Assistive device: Rolling walker (2 wheeled) Gait Pattern/deviations: Step-through pattern;Decreased stance time - left;Trunk flexed Gait velocity: decreased   General Gait Details: Cues for upper trunk control and cues for gait symmetry with progression to step through pattern.     Stairs Stairs: Yes Stairs assistance: Supervision Stair Management: One rail Left;Sideways Number of Stairs: 2 General stair comments: Cues for sequencing and hand placement on rails.    Wheelchair Mobility    Modified Rankin (Stroke Patients Only)       Balance Overall balance assessment: Needs assistance   Sitting balance-Leahy Scale: Good     Standing balance support:  Bilateral upper extremity supported Standing balance-Leahy Scale: Fair                      Cognition                            Exercises Total Joint Exercises Ankle Circles/Pumps: AROM;Both;15 reps Quad Sets: AROM;Left;10 reps;Supine Short Arc Quad: AROM;Left;10 reps;Supine Heel Slides: AROM;Left;10 reps;Supine Hip ABduction/ADduction: AROM;Left;20 reps;Supine;Standing (1x10 in standing and 1x10 in supine) Long Arc Quad: AROM;Left;10 reps;Seated Knee Flexion: AROM;Left;10 reps;Standing Marching in Standing: AROM;Left;10 reps;Standing Standing Hip Extension: AROM;Left;10 reps;Standing    General Comments        Pertinent Vitals/Pain Pain Assessment: Faces Pain Score: 6  Pain Location: Lt hip  Pain Descriptors / Indicators: Aching Pain Intervention(s): Limited activity within patient's tolerance;Repositioned;Ice applied    Home Living                      Prior Function            PT Goals (current goals can now be found in the care plan section) Acute Rehab PT Goals Patient Stated Goal: go home from the hospital Potential to Achieve Goals: Good Progress towards PT goals: Progressing toward goals    Frequency    7X/week      PT Plan Current plan remains appropriate    Co-evaluation             End of Session Equipment Utilized During Treatment: Gait belt Activity Tolerance: Patient tolerated treatment well Patient left: with call bell/phone within reach;in  bed     Time: 1001-1026 PT Time Calculation (min) (ACUTE ONLY): 25 min  Charges:  $Gait Training: 8-22 mins $Therapeutic Exercise: 8-22 mins                    G Codes:      Florestine Aversimee J Asako Saliba 03/12/2016, 10:37 AM  Joycelyn RuaAimee Kavitha Lansdale, PTA pager [phone]

## 2016-03-12 NOTE — Care Management Note (Signed)
Case Management Note  Patient Details  Name: Katrina SmackDarlene Gordon MRN: [id number] Date of Birth: 02/18/1951  Subjective/Objective:  65 yr old female s/p left total hip arthroplasty.                  Action/Plan: Patient was preoperatively setup with [facility], no changes. Will have assistance at discharge.   Expected Discharge Date:   03/13/16               Expected Discharge Plan:  Home w Home Health Services  In-House Referral:     Discharge planning Services  CM Consult  Post Acute Care Choice:  Home Health, Durable Medical Equipment Choice offered to:  Patient  DME Arranged:  3-N-1, Walker rolling DME Agency:  [employer]  HH Arranged:  PT HH Agency:  [facility]  Status of Service:  Completed, signed off  If discussed at Long Length of Stay Meetings, dates discussed:    Additional Comments:  Durenda GuthrieBrady, Reene Harlacher Naomi, RN 03/12/2016, 1:02 PM

## 2016-08-23 ENCOUNTER — Encounter: Payer: 59 | Admitting: Women's Health

## 2016-08-23 DIAGNOSIS — Z0289 Encounter for other administrative examinations: Secondary | ICD-10-CM

## 2016-09-03 ENCOUNTER — Encounter: Payer: Self-pay | Admitting: Women's Health

## 2016-09-03 ENCOUNTER — Ambulatory Visit (INDEPENDENT_AMBULATORY_CARE_PROVIDER_SITE_OTHER): Payer: 59 | Admitting: Women's Health

## 2016-09-03 VITALS — BP 120/76 | Ht 65.5 in | Wt 162.0 lb

## 2016-09-03 DIAGNOSIS — Z13220 Encounter for screening for lipoid disorders: Secondary | ICD-10-CM | POA: Diagnosis not present

## 2016-09-03 DIAGNOSIS — Z79890 Hormone replacement therapy (postmenopausal): Secondary | ICD-10-CM | POA: Diagnosis not present

## 2016-09-03 DIAGNOSIS — Z01419 Encounter for gynecological examination (general) (routine) without abnormal findings: Secondary | ICD-10-CM

## 2016-09-03 DIAGNOSIS — R21 Rash and other nonspecific skin eruption: Secondary | ICD-10-CM | POA: Diagnosis not present

## 2016-09-03 LAB — LIPID PANEL
CHOLESTEROL: 208 mg/dL — AB (ref <200)
HDL: 80 mg/dL (ref >50)
LDL Cholesterol: 97 mg/dL (ref <100)
TRIGLYCERIDES: 153 mg/dL — AB (ref <150)
Total CHOL/HDL Ratio: 2.6 Ratio (ref <5.0)
VLDL: 31 mg/dL — AB (ref <30)

## 2016-09-03 LAB — COMPREHENSIVE METABOLIC PANEL
ALBUMIN: 4.2 g/dL (ref 3.6–5.1)
ALT: 11 U/L (ref 6–29)
AST: 16 U/L (ref 10–35)
Alkaline Phosphatase: 81 U/L (ref 33–130)
BUN: 18 mg/dL (ref 7–25)
CHLORIDE: 105 mmol/L (ref 98–110)
CO2: 25 mmol/L (ref 20–31)
CREATININE: 1.24 mg/dL — AB (ref 0.50–0.99)
Calcium: 9.5 mg/dL (ref 8.6–10.4)
Glucose, Bld: 90 mg/dL (ref 65–99)
POTASSIUM: 4 mmol/L (ref 3.5–5.3)
SODIUM: 141 mmol/L (ref 135–146)
TOTAL PROTEIN: 6.5 g/dL (ref 6.1–8.1)
Total Bilirubin: 0.5 mg/dL (ref 0.2–1.2)

## 2016-09-03 LAB — CBC WITH DIFFERENTIAL/PLATELET
BASOS ABS: 55 {cells}/uL (ref 0–200)
Basophils Relative: 1 %
EOS ABS: 275 {cells}/uL (ref 15–500)
Eosinophils Relative: 5 %
HEMATOCRIT: 44.9 % (ref 35.0–45.0)
HEMOGLOBIN: 14.8 g/dL (ref 11.7–15.5)
LYMPHS ABS: 1815 {cells}/uL (ref 850–3900)
Lymphocytes Relative: 33 %
MCH: 29.7 pg (ref 27.0–33.0)
MCHC: 33 g/dL (ref 32.0–36.0)
MCV: 90 fL (ref 80.0–100.0)
MPV: 9.5 fL (ref 7.5–12.5)
Monocytes Absolute: 550 cells/uL (ref 200–950)
Monocytes Relative: 10 %
Neutro Abs: 2805 cells/uL (ref 1500–7800)
Neutrophils Relative %: 51 %
Platelets: 294 10*3/uL (ref 140–400)
RBC: 4.99 MIL/uL (ref 3.80–5.10)
RDW: 13.4 % (ref 11.0–15.0)
WBC: 5.5 10*3/uL (ref 3.8–10.8)

## 2016-09-03 MED ORDER — PREMPRO 0.3-1.5 MG PO TABS: ORAL_TABLET | ORAL | 4 refills | Status: DC

## 2016-09-03 MED ORDER — NYSTATIN-TRIAMCINOLONE 100000-0.1 UNIT/GM-% EX CREA: TOPICAL_CREAM | CUTANEOUS | 2 refills | Status: DC

## 2016-09-03 NOTE — Progress Notes (Signed)
Katrina Gordon 1950/11/22 [id number]    History:    Presents for annual exam. Postmenopausal with no bleeding on HRT. Denies sexual activity/years. Normal pap and mammography history. Has not had Pneumovax or Zostavax. Has never had colonoscopy.   Past medical history, past surgical history, family history and social history were all reviewed and documented in the EPIC chart. 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 Healthy lifestyle of exercise.  2 daughters 1 is married and one is getting married in September. Has had 2 hip replacement surgeries for osteoarthritis doing well.  ROS:  A ROS was performed and pertinent positives and negatives are included.  Exam:  Vitals:   09/03/16 1442  BP: 120/76  Weight: 162 lb (73.5 kg)  Height: 5' 5.5" (1.664 m)   Body mass index is 26.55 kg/m.   General appearance:  Normal Thyroid:  Symmetrical, normal in size, without palpable masses or nodularity. Respiratory  Auscultation:  Clear without wheezing or rhonchi Cardiovascular  Auscultation:  Regular rate, without rubs, murmurs or gallops  Edema/varicosities:  Not grossly evident Abdominal  Soft,nontender, without masses, guarding or rebound.  Liver/spleen:  No organomegaly noted  Hernia:  None appreciated  Skin  Inspection:  Grossly normal   Breasts: Examined lying and sitting.     Right: Without masses, retractions, discharge or axillary adenopathy.     Left: Without masses, retractions, discharge or axillary adenopathy. Gentitourinary   Inguinal/mons:  Normal without inguinal adenopathy  External genitalia:  Normal  BUS/Urethra/Skene's glands:  Normal  Vagina:  Normal  Cervix:  Normal  Uterus:  Normal in size, shape and contour.  Midline and mobile  Adnexa/parametria:     Rt: Without masses or tenderness.   Lt: Without masses or tenderness.  Anus and perineum: Normal  Digital rectal exam: Normal  sphincter tone without palpated masses or tenderness  Assessment/Plan:  66 y.o. DWF G2P2 for annual exam with no complaints  Postmenopausal/HRT/no bleeding Osteopenia Overdue for vaccines and colonoscopy   Plan: Continue Prempro 3/1.5 by mouth daily is trying to decrease to every other day, continues with hot flushes that caused poor sleep.. Risks of breast cancer, stroke, and blood clots were discussed. Discussed importance of Pneumovax and Zostavax vaccines, prescriptions given. Discussed importance of routine colonoscopy, the [facility] information given strongly encouraged to schedule. Schedule DEXA and annual mammogram. Refill  Mycolog cream, uses occasionally after exercise. SBE's, calcium rich diet, vitamin D 1000 daily. CBC  CMP, lipid panel, vitamin D. Currently not sexually active, encouraged condoms if becomes.    Harrington Challenger [facility], 3:31 PM 09/03/2016

## 2016-09-03 NOTE — Patient Instructions (Addendum)
Colonoscopy  [facility]  Dr Carlean Purl [place] Maintenance for Postmenopausal Women Menopause is a normal process in which your reproductive ability comes to an end. This process happens gradually over a span of months to years, usually between the ages of 65 and 57. Menopause is complete when you have missed 12 consecutive menstrual periods. It is important to talk with your health care provider about some of the most common conditions that affect postmenopausal women, such as heart disease, cancer, and bone loss (osteoporosis). Adopting a healthy lifestyle and getting preventive care can help to promote your health and wellness. Those actions can also lower your chances of developing some of these common conditions. What should I know about menopause? During menopause, you may experience a number of symptoms, such as:  Moderate-to-severe hot flashes.  Night sweats.  Decrease in sex drive.  Mood swings.  Headaches.  Tiredness.  Irritability.  Memory problems.  Insomnia. Choosing to treat or not to treat menopausal changes is an individual decision that you make with your health care provider. What should I know about hormone replacement therapy and supplements? Hormone therapy products are effective for treating symptoms that are associated with menopause, such as hot flashes and night sweats. Hormone replacement carries certain risks, especially as you become older. If you are thinking about using estrogen or estrogen with progestin treatments, discuss the benefits and risks with your health care provider. What should I know about heart disease and stroke? Heart disease, heart attack, and stroke become more likely as you age. This may be due, in part, to the hormonal changes that your body experiences during menopause. These can affect how your body processes dietary fats, triglycerides, and cholesterol. Heart attack and stroke are both medical emergencies. There are many things  that you can do to help prevent heart disease and stroke:  Have your blood pressure checked at least every 1-2 years. High blood pressure causes heart disease and increases the risk of stroke.  If you are 78-56 years old, ask your health care provider if you should take aspirin to prevent a heart attack or a stroke.  Do not use any tobacco products, including cigarettes, chewing tobacco, or electronic cigarettes. If you need help quitting, ask your health care provider.  It is important to eat a healthy diet and maintain a healthy weight.  Be sure to include plenty of vegetables, fruits, low-fat dairy products, and lean protein.  Avoid eating foods that are high in solid fats, added sugars, or salt (sodium).  Get regular exercise. This is one of the most important things that you can do for your health.  Try to exercise for at least 150 minutes each week. The type of exercise that you do should increase your heart rate and make you sweat. This is known as moderate-intensity exercise.  Try to do strengthening exercises at least twice each week. Do these in addition to the moderate-intensity exercise.  Know your numbers.Ask your health care provider to check your cholesterol and your blood glucose. Continue to have your blood tested as directed by your health care provider. What should I know about cancer screening? There are several types of cancer. Take the following steps to reduce your risk and to catch any cancer development as early as possible. Breast Cancer  Practice breast self-awareness.  This means understanding how your breasts normally appear and feel.  It also means doing regular breast self-exams. Let your health care provider know about any changes, no matter  how small.  If you are 85 or older, have a clinician do a breast exam (clinical breast exam or CBE) every year. Depending on your age, family history, and medical history, it may be recommended that you also have a  yearly breast X-ray (mammogram).  If you have a family history of breast cancer, talk with your health care provider about genetic screening.  If you are at high risk for breast cancer, talk with your health care provider about having an MRI and a mammogram every year.  Breast cancer (BRCA) gene test is recommended for women who have family members with BRCA-related cancers. Results of the assessment will determine the need for genetic counseling and BRCA1 and for BRCA2 testing. BRCA-related cancers include these types:  Breast. This occurs in males or females.  Ovarian.  Tubal. This may also be called fallopian tube cancer.  Cancer of the abdominal or pelvic lining (peritoneal cancer).  Prostate.  Pancreatic. Cervical, Uterine, and Ovarian Cancer  Your health care provider may recommend that you be screened regularly for cancer of the pelvic organs. These include your ovaries, uterus, and vagina. This screening involves a pelvic exam, which includes checking for microscopic changes to the surface of your cervix (Pap test).  For women ages 21-65, health care providers may recommend a pelvic exam and a Pap test every three years. For women ages 21-65, they may recommend the Pap test and pelvic exam, combined with testing for human papilloma virus (HPV), every five years. Some types of HPV increase your risk of cervical cancer. Testing for HPV may also be done on women of any age who have unclear Pap test results.  Other health care providers may not recommend any screening for nonpregnant women who are considered low risk for pelvic cancer and have no symptoms. Ask your health care provider if a screening pelvic exam is right for you.  If you have had past treatment for cervical cancer or a condition that could lead to cancer, you need Pap tests and screening for cancer for at least 20 years after your treatment. If Pap tests have been discontinued for you, your risk factors (such as having  a new sexual partner) need to be reassessed to determine if you should start having screenings again. Some women have medical problems that increase the chance of getting cervical cancer. In these cases, your health care provider may recommend that you have screening and Pap tests more often.  If you have a family history of uterine cancer or ovarian cancer, talk with your health care provider about genetic screening.  If you have vaginal bleeding after reaching menopause, tell your health care provider.  There are currently no reliable tests available to screen for ovarian cancer. Lung Cancer  Lung cancer screening is recommended for adults 50-71 years old who are at high risk for lung cancer because of a history of smoking. A yearly low-dose CT scan of the lungs is recommended if you:  Currently smoke.  Have a history of at least 30 pack-years of smoking and you currently smoke or have quit within the past 15 years. A pack-year is smoking an average of one pack of cigarettes per day for one year. Yearly screening should:  Continue until it has been 15 years since you quit.  Stop if you develop a health problem that would prevent you from having lung cancer treatment. Colorectal Cancer  This type of cancer can be detected and can often be prevented.  Routine colorectal  cancer screening usually begins at age 46 and continues through age 22.  If you have risk factors for colon cancer, your health care provider may recommend that you be screened at an earlier age.  If you have a family history of colorectal cancer, talk with your health care provider about genetic screening.  Your health care provider may also recommend using home test kits to check for hidden blood in your stool.  A small camera at the end of a tube can be used to examine your colon directly (sigmoidoscopy or colonoscopy). This is done to check for the earliest forms of colorectal cancer.  Direct examination of the  colon should be repeated every 5-10 years until age 66. However, if early forms of precancerous polyps or small growths are found or if you have a family history or genetic risk for colorectal cancer, you may need to be screened more often. Skin Cancer  Check your skin from head to toe regularly.  Monitor any moles. Be sure to tell your health care provider:  About any new moles or changes in moles, especially if there is a change in a mole's shape or color.  If you have a mole that is larger than the size of a pencil eraser.  If any of your family members has a history of skin cancer, especially at a young age, talk with your health care provider about genetic screening.  Always use sunscreen. Apply sunscreen liberally and repeatedly throughout the day.  Whenever you are outside, protect yourself by wearing long sleeves, pants, a wide-brimmed hat, and sunglasses. What should I know about osteoporosis? Osteoporosis is a condition in which bone destruction happens more quickly than new bone creation. After menopause, you may be at an increased risk for osteoporosis. To help prevent osteoporosis or the bone fractures that can happen because of osteoporosis, the following is recommended:  If you are 58-23 years old, get at least 1,000 mg of calcium and at least 600 mg of vitamin D per day.  If you are older than age 67 but younger than age 78, get at least 1,200 mg of calcium and at least 600 mg of vitamin D per day.  If you are older than age 74, get at least 1,200 mg of calcium and at least 800 mg of vitamin D per day. Smoking and excessive alcohol intake increase the risk of osteoporosis. Eat foods that are rich in calcium and vitamin D, and do weight-bearing exercises several times each week as directed by your health care provider. What should I know about how menopause affects my mental health? Depression may occur at any age, but it is more common as you become older. Common symptoms of  depression include:  Low or sad mood.  Changes in sleep patterns.  Changes in appetite or eating patterns.  Feeling an overall lack of motivation or enjoyment of activities that you previously enjoyed.  Frequent crying spells. Talk with your health care provider if you think that you are experiencing depression. What should I know about immunizations? It is important that you get and maintain your immunizations. These include:  Tetanus, diphtheria, and pertussis (Tdap) booster vaccine.  Influenza every year before the flu season begins.  Pneumonia vaccine.  Shingles vaccine. Your health care provider may also recommend other immunizations. This information is not intended to replace advice given to you by your health care provider. Make sure you discuss any questions you have with your health care provider. Document Released: 07/12/2005  Document Revised: 12/08/2015 Document Reviewed: 02/21/2015 Elsevier Interactive Patient Education  2017 [employer].

## 2016-09-04 ENCOUNTER — Other Ambulatory Visit: Payer: Self-pay | Admitting: *Deleted

## 2016-09-04 DIAGNOSIS — R748 Abnormal levels of other serum enzymes: Secondary | ICD-10-CM

## 2016-09-04 LAB — VITAMIN D 25 HYDROXY (VIT D DEFICIENCY, FRACTURES): Vit D, 25-Hydroxy: 39 ng/mL (ref 30–100)

## 2016-09-25 ENCOUNTER — Encounter: Payer: Self-pay | Admitting: Women's Health

## 2016-10-08 ENCOUNTER — Other Ambulatory Visit: Payer: Self-pay

## 2016-10-08 DIAGNOSIS — R21 Rash and other nonspecific skin eruption: Secondary | ICD-10-CM

## 2016-10-08 DIAGNOSIS — Z79890 Hormone replacement therapy (postmenopausal): Secondary | ICD-10-CM

## 2016-10-08 MED ORDER — NYSTATIN-TRIAMCINOLONE 100000-0.1 UNIT/GM-% EX CREA: TOPICAL_CREAM | CUTANEOUS | 2 refills | Status: DC

## 2016-10-08 MED ORDER — PREMPRO 0.3-1.5 MG PO TABS: ORAL_TABLET | ORAL | 4 refills | Status: DC

## 2016-10-16 ENCOUNTER — Encounter: Payer: Self-pay | Admitting: Gynecology

## 2017-04-16 ENCOUNTER — Other Ambulatory Visit (HOSPITAL_COMMUNITY): Payer: Self-pay | Admitting: Orthopedic Surgery

## 2017-04-16 DIAGNOSIS — M25552 Pain in left hip: Principal | ICD-10-CM

## 2017-04-16 DIAGNOSIS — M25551 Pain in right hip: Secondary | ICD-10-CM

## 2017-07-18 IMAGING — RF DG C-ARM 61-120 MIN
1 series · 2 of 2 positions shown · non-contrast
Comparison: None.

CLINICAL DATA: Status post anterior approach left total hip
arthroplasty. Intraoperative fluoro spot images. Fluoro time
reported is 20 seconds.

EXAM:
OPERATIVE left HIP (WITH PELVIS IF PERFORMED) 2 VIEWS
TECHNIQUE: Fluoroscopic spot image(s) were submitted for interpretation
post-operatively.

[Series 1: run · 2 of 2 slices shown]
[im 1/2]
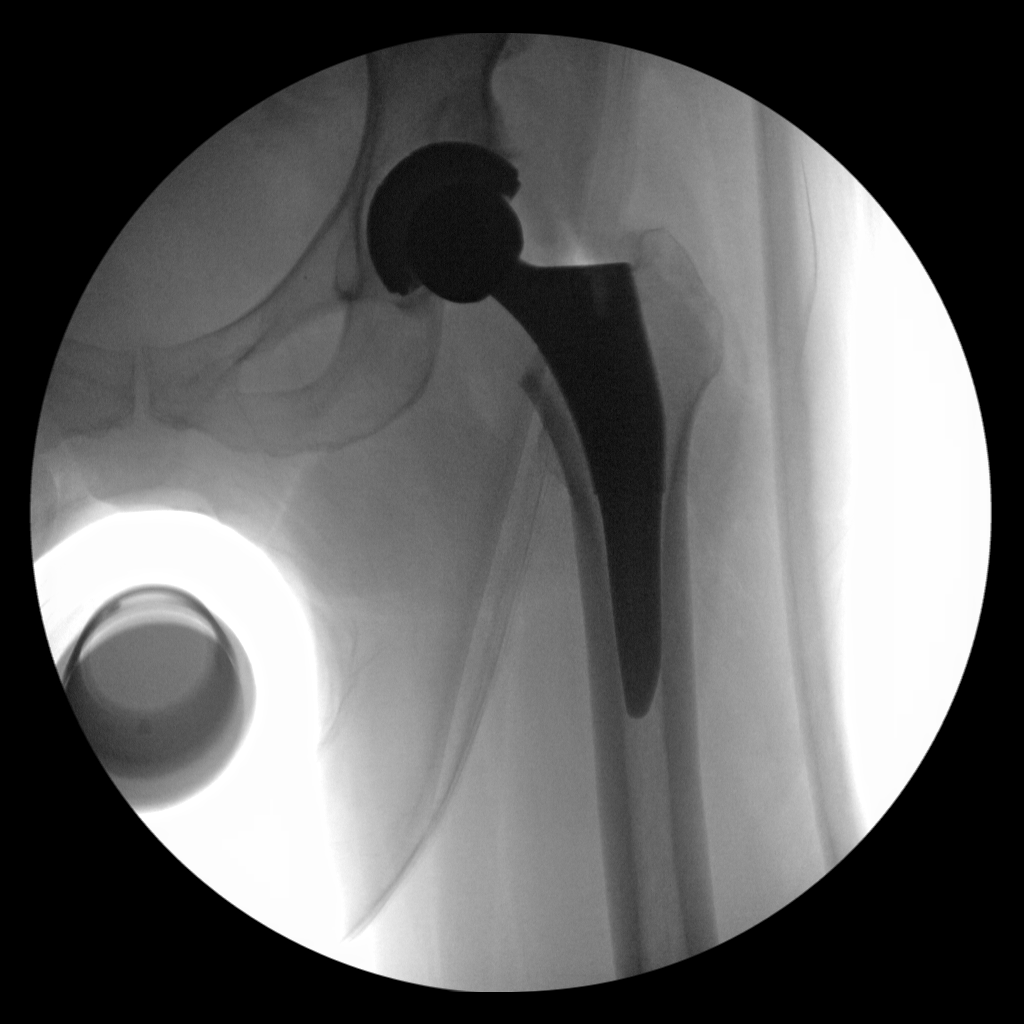
[im 2/2]
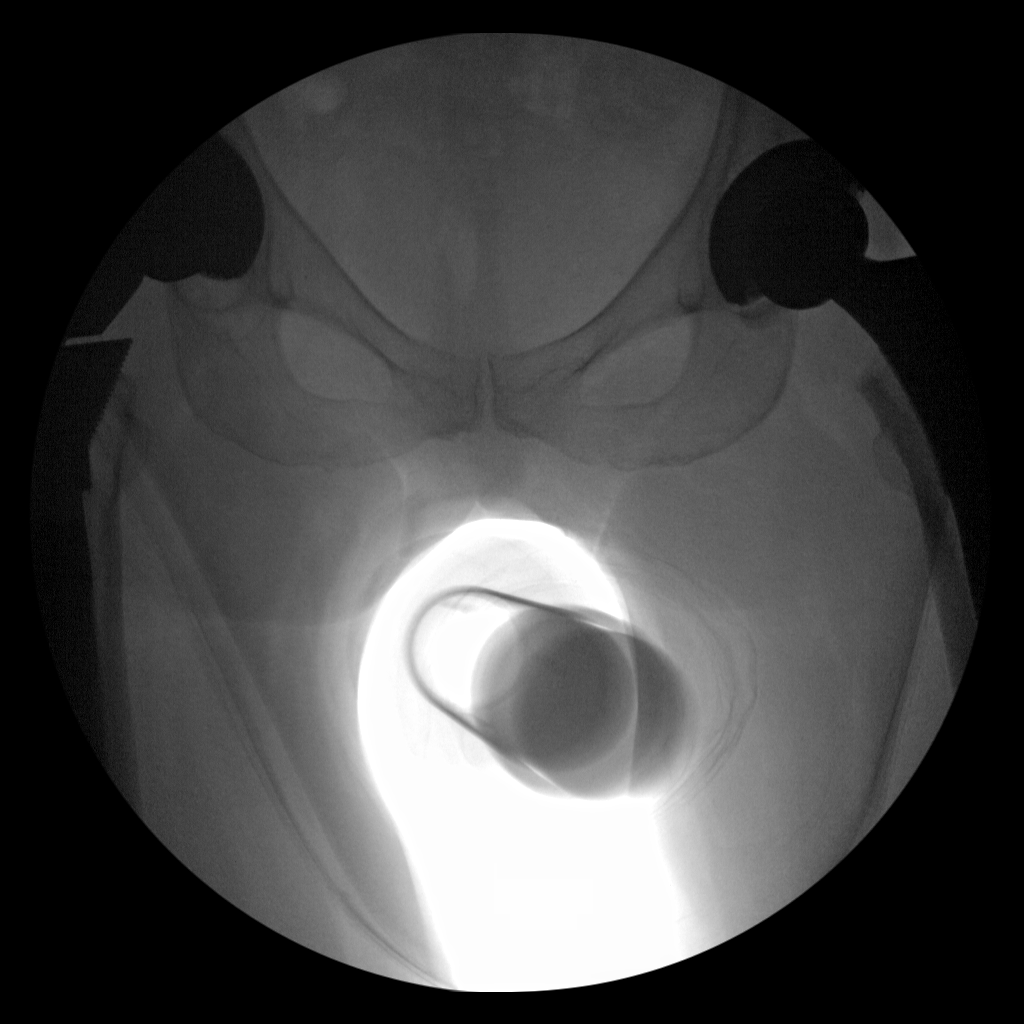

[2 of 2 positions shown; findings below may reference images not displayed]

FINDINGS: Two AP views of the left hemipelvis reveal appropriate positioning
of the prosthetic components of the hip. The interface with the
native bone appears normal. No native bone fracture is observed.
IMPRESSION: No evidence of immediate complication following anterior approach
left total hip joint replacement.

## 2017-09-08 ENCOUNTER — Encounter: Payer: Self-pay | Admitting: Women's Health

## 2017-09-08 ENCOUNTER — Ambulatory Visit (INDEPENDENT_AMBULATORY_CARE_PROVIDER_SITE_OTHER): Payer: Medicare Other | Admitting: Women's Health

## 2017-09-08 VITALS — BP 128/80 | Ht 65.0 in | Wt 162.0 lb

## 2017-09-08 DIAGNOSIS — R21 Rash and other nonspecific skin eruption: Secondary | ICD-10-CM

## 2017-09-08 DIAGNOSIS — Z01419 Encounter for gynecological examination (general) (routine) without abnormal findings: Secondary | ICD-10-CM

## 2017-09-08 DIAGNOSIS — Z79890 Hormone replacement therapy (postmenopausal): Secondary | ICD-10-CM | POA: Diagnosis not present

## 2017-09-08 DIAGNOSIS — Z13820 Encounter for screening for osteoporosis: Secondary | ICD-10-CM

## 2017-09-08 DIAGNOSIS — E2839 Other primary ovarian failure: Secondary | ICD-10-CM

## 2017-09-08 MED ORDER — NYSTATIN-TRIAMCINOLONE 100000-0.1 UNIT/GM-% EX CREA: TOPICAL_CREAM | CUTANEOUS | 2 refills | Status: DC

## 2017-09-08 MED ORDER — PREMPRO 0.3-1.5 MG PO TABS: ORAL_TABLET | ORAL | 4 refills | Status: DC

## 2017-09-08 NOTE — Progress Notes (Signed)
Katrina Gordon 08/20/1950 [id number]    History:    Presents for annual exam.  Postmenopausal on HRT with no bleeding.  Normal Pap and mammogram history.  Has not had a screening colonoscopy.  Has not had Pneumovax or Shingrex.  Osteoarthritis has had 2 hip replacements and currently having some knee problems.  History of osteopenia last DEXA 5 years ago.  Not sexually active in years.  Past medical history, past surgical history, family history and social history were all reviewed and documented in the EPIC chart.  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  Has a healthy lifestyle with regular exercise and healthy diet.  2 daughters both married.  ROS:  A ROS was performed and pertinent positives and negatives are included.  Exam:  Vitals:   09/08/17 1443  BP: 128/80  Weight: 162 lb (73.5 kg)  Height: 5\' 5"  (1.651 m)   Body mass index is 26.96 kg/m.   General appearance:  Normal Thyroid:  Symmetrical, normal in size, without palpable masses or nodularity. Respiratory  Auscultation:  Clear without wheezing or rhonchi Cardiovascular  Auscultation:  Regular rate, without rubs, murmurs or gallops  Edema/varicosities:  Not grossly evident Abdominal  Soft,nontender, without masses, guarding or rebound.  Liver/spleen:  No organomegaly noted  Hernia:  None appreciated  Skin  Inspection:  Grossly normal   Breasts: Examined lying and sitting.     Right: Without masses, retractions, discharge or axillary adenopathy.     Left: Without masses, retractions, discharge or axillary adenopathy. Gentitourinary   Inguinal/mons:  Normal without inguinal adenopathy  External genitalia:  Normal  BUS/Urethra/Skene's glands:  Normal  Vagina:  Normal  Cervix:  Normal  Uterus:   normal in size, shape and contour.  Midline and mobile  Adnexa/parametria:     Rt: Without masses or tenderness.   Lt: Without masses or tenderness.  Anus and perineum: Normal  Digital rectal exam: Normal sphincter tone without palpated  masses or tenderness  Assessment/Plan:  67 y.o. D WF G4, P2 for breast and pelvic exam.  Postmenopausal on HRT with no bleeding Osteoarthritis-orthopedist manages Osteopenia  Plan: HRT reviewed risks of clots, strokes and breast cancer, currently on Prempro 0.3/1.5 taking half tablet daily.  States numerous hot flashes and does not feel well when off.  Prescription given after risks reviewed and best to use shortness of amount of time.  SBE's, continue annual screening mammogram, calcium rich foods, vitamin D 2000 daily encouraged.  Home safety, fall prevention and importance of continuing regular exercise with balance exercises included.  Repeat DEXA, instructed to schedule.  Screening colonoscopy discussed, [facility] information given instructed to schedule.  Instructed to schedule annual exam with primary care for labs and vaccines - Shingrex and Pneumovax.  Refill for Mycolog given for occasional external vaginal itching.    Harrington Challengerancy J Calene Paradiso Community Memorial HospitalWHNP, 5:07 PM 09/08/2017

## 2017-09-08 NOTE — Patient Instructions (Signed)
[facility]  Dr Carlean Purl [phone]  Shingrex  Shingles  Pnuemovac  Health Maintenance for Postmenopausal Women Menopause is a normal process in which your reproductive ability comes to an end. This process happens gradually over a span of months to years, usually between the ages of 53 and 45. Menopause is complete when you have missed 12 consecutive menstrual periods. It is important to talk with your health care provider about some of the most common conditions that affect postmenopausal women, such as heart disease, cancer, and bone loss (osteoporosis). Adopting a healthy lifestyle and getting preventive care can help to promote your health and wellness. Those actions can also lower your chances of developing some of these common conditions. What should I know about menopause? During menopause, you may experience a number of symptoms, such as:  Moderate-to-severe hot flashes.  Night sweats.  Decrease in sex drive.  Mood swings.  Headaches.  Tiredness.  Irritability.  Memory problems.  Insomnia.  Choosing to treat or not to treat menopausal changes is an individual decision that you make with your health care provider. What should I know about hormone replacement therapy and supplements? Hormone therapy products are effective for treating symptoms that are associated with menopause, such as hot flashes and night sweats. Hormone replacement carries certain risks, especially as you become older. If you are thinking about using estrogen or estrogen with progestin treatments, discuss the benefits and risks with your health care provider. What should I know about heart disease and stroke? Heart disease, heart attack, and stroke become more likely as you age. This may be due, in part, to the hormonal changes that your body experiences during menopause. These can affect how your body processes dietary fats, triglycerides, and cholesterol. Heart attack and stroke are both medical  emergencies. There are many things that you can do to help prevent heart disease and stroke:  Have your blood pressure checked at least every 1-2 years. High blood pressure causes heart disease and increases the risk of stroke.  If you are 58-42 years old, ask your health care provider if you should take aspirin to prevent a heart attack or a stroke.  Do not use any tobacco products, including cigarettes, chewing tobacco, or electronic cigarettes. If you need help quitting, ask your health care provider.  It is important to eat a healthy diet and maintain a healthy weight. ? Be sure to include plenty of vegetables, fruits, low-fat dairy products, and lean protein. ? Avoid eating foods that are high in solid fats, added sugars, or salt (sodium).  Get regular exercise. This is one of the most important things that you can do for your health. ? Try to exercise for at least 150 minutes each week. The type of exercise that you do should increase your heart rate and make you sweat. This is known as moderate-intensity exercise. ? Try to do strengthening exercises at least twice each week. Do these in addition to the moderate-intensity exercise.  Know your numbers.Ask your health care provider to check your cholesterol and your blood glucose. Continue to have your blood tested as directed by your health care provider.  What should I know about cancer screening? There are several types of cancer. Take the following steps to reduce your risk and to catch any cancer development as early as possible. Breast Cancer  Practice breast self-awareness. ? This means understanding how your breasts normally appear and feel. ? It also means doing regular breast self-exams. Let your health care provider  know about any changes, no matter how small.  If you are 66 or older, have a clinician do a breast exam (clinical breast exam or CBE) every year. Depending on your age, family history, and medical history, it  may be recommended that you also have a yearly breast X-ray (mammogram).  If you have a family history of breast cancer, talk with your health care provider about genetic screening.  If you are at high risk for breast cancer, talk with your health care provider about having an MRI and a mammogram every year.  Breast cancer (BRCA) gene test is recommended for women who have family members with BRCA-related cancers. Results of the assessment will determine the need for genetic counseling and BRCA1 and for BRCA2 testing. BRCA-related cancers include these types: ? Breast. This occurs in males or females. ? Ovarian. ? Tubal. This may also be called fallopian tube cancer. ? Cancer of the abdominal or pelvic lining (peritoneal cancer). ? Prostate. ? Pancreatic.  Cervical, Uterine, and Ovarian Cancer Your health care provider may recommend that you be screened regularly for cancer of the pelvic organs. These include your ovaries, uterus, and vagina. This screening involves a pelvic exam, which includes checking for microscopic changes to the surface of your cervix (Pap test).  For women ages 21-65, health care providers may recommend a pelvic exam and a Pap test every three years. For women ages 24-65, they may recommend the Pap test and pelvic exam, combined with testing for human papilloma virus (HPV), every five years. Some types of HPV increase your risk of cervical cancer. Testing for HPV may also be done on women of any age who have unclear Pap test results.  Other health care providers may not recommend any screening for nonpregnant women who are considered low risk for pelvic cancer and have no symptoms. Ask your health care provider if a screening pelvic exam is right for you.  If you have had past treatment for cervical cancer or a condition that could lead to cancer, you need Pap tests and screening for cancer for at least 20 years after your treatment. If Pap tests have been discontinued  for you, your risk factors (such as having a new sexual partner) need to be reassessed to determine if you should start having screenings again. Some women have medical problems that increase the chance of getting cervical cancer. In these cases, your health care provider may recommend that you have screening and Pap tests more often.  If you have a family history of uterine cancer or ovarian cancer, talk with your health care provider about genetic screening.  If you have vaginal bleeding after reaching menopause, tell your health care provider.  There are currently no reliable tests available to screen for ovarian cancer.  Lung Cancer Lung cancer screening is recommended for adults 66-30 years old who are at high risk for lung cancer because of a history of smoking. A yearly low-dose CT scan of the lungs is recommended if you:  Currently smoke.  Have a history of at least 30 pack-years of smoking and you currently smoke or have quit within the past 15 years. A pack-year is smoking an average of one pack of cigarettes per day for one year.  Yearly screening should:  Continue until it has been 15 years since you quit.  Stop if you develop a health problem that would prevent you from having lung cancer treatment.  Colorectal Cancer  This type of cancer can be detected  and can often be prevented.  Routine colorectal cancer screening usually begins at age 52 and continues through age 17.  If you have risk factors for colon cancer, your health care provider may recommend that you be screened at an earlier age.  If you have a family history of colorectal cancer, talk with your health care provider about genetic screening.  Your health care provider may also recommend using home test kits to check for hidden blood in your stool.  A small camera at the end of a tube can be used to examine your colon directly (sigmoidoscopy or colonoscopy). This is done to check for the earliest forms of  colorectal cancer.  Direct examination of the colon should be repeated every 5-10 years until age 67. However, if early forms of precancerous polyps or small growths are found or if you have a family history or genetic risk for colorectal cancer, you may need to be screened more often.  Skin Cancer  Check your skin from head to toe regularly.  Monitor any moles. Be sure to tell your health care provider: ? About any new moles or changes in moles, especially if there is a change in a mole's shape or color. ? If you have a mole that is larger than the size of a pencil eraser.  If any of your family members has a history of skin cancer, especially at a young age, talk with your health care provider about genetic screening.  Always use sunscreen. Apply sunscreen liberally and repeatedly throughout the day.  Whenever you are outside, protect yourself by wearing long sleeves, pants, a wide-brimmed hat, and sunglasses.  What should I know about osteoporosis? Osteoporosis is a condition in which bone destruction happens more quickly than new bone creation. After menopause, you may be at an increased risk for osteoporosis. To help prevent osteoporosis or the bone fractures that can happen because of osteoporosis, the following is recommended:  If you are 1-26 years old, get at least 1,000 mg of calcium and at least 600 mg of vitamin D per day.  If you are older than age 59 but younger than age 46, get at least 1,200 mg of calcium and at least 600 mg of vitamin D per day.  If you are older than age 26, get at least 1,200 mg of calcium and at least 800 mg of vitamin D per day.  Smoking and excessive alcohol intake increase the risk of osteoporosis. Eat foods that are rich in calcium and vitamin D, and do weight-bearing exercises several times each week as directed by your health care provider. What should I know about how menopause affects my mental health? Depression may occur at any age, but it  is more common as you become older. Common symptoms of depression include:  Low or sad mood.  Changes in sleep patterns.  Changes in appetite or eating patterns.  Feeling an overall lack of motivation or enjoyment of activities that you previously enjoyed.  Frequent crying spells.  Talk with your health care provider if you think that you are experiencing depression. What should I know about immunizations? It is important that you get and maintain your immunizations. These include:  Tetanus, diphtheria, and pertussis (Tdap) booster vaccine.  Influenza every year before the flu season begins.  Pneumonia vaccine.  Shingles vaccine.  Your health care provider may also recommend other immunizations. This information is not intended to replace advice given to you by your health care provider. Make sure you  discuss any questions you have with your health care provider. Document Released: 07/12/2005 Document Revised: 12/08/2015 Document Reviewed: 02/21/2015 Elsevier Interactive Patient Education  [employer]. 7

## 2017-10-03 LAB — URINE CULTURE
MICRO NUMBER:: 90429641
SPECIMEN QUALITY: ADEQUATE

## 2017-10-03 LAB — URINALYSIS, COMPLETE W/RFL CULTURE
BACTERIA UA: NONE SEEN /HPF
BILIRUBIN URINE: NEGATIVE
Glucose, UA: NEGATIVE
HGB URINE DIPSTICK: NEGATIVE
Hyaline Cast: NONE SEEN /LPF
KETONES UR: NEGATIVE
NITRITES URINE, INITIAL: NEGATIVE
Protein, ur: NEGATIVE
RBC / HPF: NONE SEEN /HPF (ref 0–2)
Specific Gravity, Urine: 1.021 (ref 1.001–1.03)
pH: <=5 (ref 5.0–8.0)

## 2017-10-03 LAB — CULTURE INDICATED

## 2018-03-14 ENCOUNTER — Encounter: Payer: Self-pay | Admitting: Women's Health

## 2018-03-30 LAB — IFOBT (OCCULT BLOOD): IFOBT: NEGATIVE

## 2018-09-10 ENCOUNTER — Other Ambulatory Visit: Payer: Self-pay

## 2018-09-15 ENCOUNTER — Ambulatory Visit (INDEPENDENT_AMBULATORY_CARE_PROVIDER_SITE_OTHER): Payer: Medicare Other | Admitting: Women's Health

## 2018-09-15 ENCOUNTER — Encounter: Payer: Self-pay | Admitting: Women's Health

## 2018-09-15 ENCOUNTER — Other Ambulatory Visit: Payer: Self-pay

## 2018-09-15 VITALS — BP 132/78 | Ht 65.25 in | Wt 159.6 lb

## 2018-09-15 DIAGNOSIS — R21 Rash and other nonspecific skin eruption: Secondary | ICD-10-CM

## 2018-09-15 DIAGNOSIS — Z01419 Encounter for gynecological examination (general) (routine) without abnormal findings: Secondary | ICD-10-CM | POA: Diagnosis not present

## 2018-09-15 DIAGNOSIS — Z79890 Hormone replacement therapy (postmenopausal): Secondary | ICD-10-CM

## 2018-09-15 MED ORDER — NYSTATIN-TRIAMCINOLONE 100000-0.1 UNIT/GM-% EX CREA
TOPICAL_CREAM | CUTANEOUS | 2 refills | Status: DC
Start: 2018-09-15 — End: 2019-09-21

## 2018-09-15 MED ORDER — PREMPRO 0.3-1.5 MG PO TABS
ORAL_TABLET | ORAL | 4 refills | Status: DC
Start: 2018-09-15 — End: 2018-12-01

## 2018-09-15 MED ORDER — PREMPRO 0.3-1.5 MG PO TABS: ORAL_TABLET | ORAL | 4 refills | Status: DC

## 2018-09-15 NOTE — Progress Notes (Signed)
Katrina Gordon 07-24-1950 [id number]    History:    Presents for breast and pelvic exam.  Postmenopausal on Prempro 0.3/1.5 with no bleeding.  Normal Pap and mammogram history.  Overdue for mammogram.  Has never had a screening colonoscopy, reports negative Cologuard.  2013 T score -1.3 at spine but has had 2 hip replacements for osteoarthritis.  Has not had Shingrix or Pneumovax.  Not sexually active.  Healthy lifestyle of regular exercise.  Past medical history, past surgical history, family history and social history were all reviewed and documented in the EPIC chart.  Retired.  2 daughters both live in the state.  ROS:  A ROS was performed and pertinent positives and negatives are included.  Exam:  Vitals:   09/15/18 1110  BP: 132/78  Weight: 159 lb 9.6 oz (72.4 kg)  Height: 5' 5.25" (1.657 m)   Body mass index is 26.36 kg/m.   General appearance:  Normal Thyroid:  Symmetrical, normal in size, without palpable masses or nodularity. Respiratory  Auscultation:  Clear without wheezing or rhonchi Cardiovascular  Auscultation:  Regular rate, without rubs, murmurs or gallops  Edema/varicosities:  Not grossly evident Abdominal tummy tuck  Soft,nontender, without masses, guarding or rebound.  Liver/spleen:  No organomegaly noted  Hernia:  None appreciated  Skin  Inspection:  Grossly normal   Breasts: Examined lying and sitting.     Right: Without masses, retractions, discharge or axillary adenopathy.     Left: Without masses, retractions, discharge or axillary adenopathy. Gentitourinary   Inguinal/mons:  Normal without inguinal adenopathy  External genitalia:  Normal  BUS/Urethra/Skene's glands:  Normal  Vagina:  Normal  Cervix:  Normal  Uterus:   normal in size, shape and contour.  Midline and mobile  Adnexa/parametria:     Rt: Without masses or tenderness.   Lt: Without masses or tenderness.  Anus and perineum: Normal  Digital rectal exam: Normal sphincter tone without  palpated masses or tenderness  Assessment/Plan:  68 y.o. D WF G4, P2 for breast and pelvic exam.  Postmenopausal on Prempro 0.3/1.5 with no bleeding Osteoarthritis-2 hip replacements  Plan: HRT risks of blood clots, strokes and breast cancer reviewed.  Reviewed stopping Prempro prior to next orthopedic surgery -  questionable knee replacement.  States feels better on and would like to continue, Prempro 0.3/1.5 prescription given.  SBEs, overdue for mammogram, strongly encouraged scheduling [facility] information given.  Continue healthy lifestyle of regular exercise, swimming, calcium rich foods, vitamin D 2000 daily encouraged.  Instructed to schedule annual exam with primary care for  labs screening labs and Pneumovax.  Shingrex reviewed and encouraged.  Long discussion concerning screening colonoscopy declines.  Declines repeat DEXA.   Harrington Challenger [facility], 2:18 PM 09/15/2018

## 2018-09-15 NOTE — Patient Instructions (Addendum)
pnuemovac vaccine - primary care  shingrex 2 series vaccine  Vit D3 2000iu  Daily  [facility]  Mammogram  [phone]  SCHEDULE PRIMARY CARE VIIST!!!!  Dannielle HuhOLONONSCOPY  [facility]  DR Leone PayorGESSNER  [phone]   Health Maintenance After Age 70 After age 70, you are at a higher risk for certain long-term diseases and infections as well as injuries from falls. Falls are a major cause of broken bones and head injuries in people who are older than age 70. Getting regular preventive care can help to keep you healthy and well. Preventive care includes getting regular testing and making lifestyle changes as recommended by your health care provider. Talk with your health care provider about:  Which screenings and tests you should have. A screening is a test that checks for a disease when you have no symptoms.  A diet and exercise plan that is right for you. What should I know about screenings and tests to prevent falls? Screening and testing are the best ways to find a health problem early. Early diagnosis and treatment give you the best chance of managing medical conditions that are common after age 70. Certain conditions and lifestyle choices may make you more likely to have a fall. Your health care provider may recommend:  Regular vision checks. Poor vision and conditions such as cataracts can make you more likely to have a fall. If you wear glasses, make sure to get your prescription updated if your vision changes.  Medicine review. Work with your health care provider to regularly review all of the medicines you are taking, including over-the-counter medicines. Ask your health care provider about any side effects that may make you more likely to have a fall. Tell your health care provider if any medicines that you take make you feel dizzy or sleepy.  Osteoporosis screening. Osteoporosis is a condition that causes the bones to get weaker. This can make the bones weak and cause them to break more easily.  Blood  pressure screening. Blood pressure changes and medicines to control blood pressure can make you feel dizzy.  Strength and balance checks. Your health care provider may recommend certain tests to check your strength and balance while standing, walking, or changing positions.  Foot health exam. Foot pain and numbness, as well as not wearing proper footwear, can make you more likely to have a fall.  Depression screening. You may be more likely to have a fall if you have a fear of falling, feel emotionally low, or feel unable to do activities that you used to do.  Alcohol use screening. Using too much alcohol can affect your balance and may make you more likely to have a fall. What actions can I take to lower my risk of falls? General instructions  Talk with your health care provider about your risks for falling. Tell your health care provider if: ? You fall. Be sure to tell your health care provider about all falls, even ones that seem minor. ? You feel dizzy, sleepy, or off-balance.  Take over-the-counter and prescription medicines only as told by your health care provider. These include any supplements.  Eat a healthy diet and maintain a healthy weight. A healthy diet includes low-fat dairy products, low-fat (lean) meats, and fiber from whole grains, beans, and lots of fruits and vegetables. Home safety  Remove any tripping hazards, such as rugs, cords, and clutter.  Install safety equipment such as grab bars in bathrooms and safety rails on stairs.  Keep rooms and walkways well-lit.  Activity   Follow a regular exercise program to stay fit. This will help you maintain your balance. Ask your health care provider what types of exercise are appropriate for you.  If you need a cane or walker, use it as recommended by your health care provider.  Wear supportive shoes that have nonskid soles. Lifestyle  Do not drink alcohol if your health care provider tells you not to drink.  If you  drink alcohol, limit how much you have: ? 0-1 drink a day for women. ? 0-2 drinks a day for men.  Be aware of how much alcohol is in your drink. In the [place] one drink equals one typical bottle of beer (12 oz), one-half glass of wine (5 oz), or one shot of hard liquor (1 oz).  Do not use any products that contain nicotine or tobacco, such as cigarettes and e-cigarettes. If you need help quitting, ask your health care provider. Summary  Having a healthy lifestyle and getting preventive care can help to protect your health and wellness after age 74.  Screening and testing are the best way to find a health problem early and help you avoid having a fall. Early diagnosis and treatment give you the best chance for managing medical conditions that are more common for people who are older than age 21.  Falls are a major cause of broken bones and head injuries in people who are older than age 22. Take precautions to prevent a fall at home.  Work with your health care provider to learn what changes you can make to improve your health and wellness and to prevent falls. This information is not intended to replace advice given to you by your health care provider. Make sure you discuss any questions you have with your health care provider. Document Released: 04/02/2017 Document Revised: 04/02/2017 Document Reviewed: 04/02/2017 Elsevier Interactive Patient Education  2019 [employer].   Health Maintenance After Age 71 After age 13, you are at a higher risk for certain long-term diseases and infections as well as injuries from falls. Falls are a major cause of broken bones and head injuries in people who are older than age 49. Getting regular preventive care can help to keep you healthy and well. Preventive care includes getting regular testing and making lifestyle changes as recommended by your health care provider. Talk with your health care provider about:  Which screenings and tests you should have. A  screening is a test that checks for a disease when you have no symptoms.  A diet and exercise plan that is right for you. What should I know about screenings and tests to prevent falls? Screening and testing are the best ways to find a health problem early. Early diagnosis and treatment give you the best chance of managing medical conditions that are common after age 11. Certain conditions and lifestyle choices may make you more likely to have a fall. Your health care provider may recommend:  Regular vision checks. Poor vision and conditions such as cataracts can make you more likely to have a fall. If you wear glasses, make sure to get your prescription updated if your vision changes.  Medicine review. Work with your health care provider to regularly review all of the medicines you are taking, including over-the-counter medicines. Ask your health care provider about any side effects that may make you more likely to have a fall. Tell your health care provider if any medicines that you take make you feel dizzy or sleepy.  Osteoporosis screening. Osteoporosis is a condition that causes the bones to get weaker. This can make the bones weak and cause them to break more easily.  Blood pressure screening. Blood pressure changes and medicines to control blood pressure can make you feel dizzy.  Strength and balance checks. Your health care provider may recommend certain tests to check your strength and balance while standing, walking, or changing positions.  Foot health exam. Foot pain and numbness, as well as not wearing proper footwear, can make you more likely to have a fall.  Depression screening. You may be more likely to have a fall if you have a fear of falling, feel emotionally low, or feel unable to do activities that you used to do.  Alcohol use screening. Using too much alcohol can affect your balance and may make you more likely to have a fall. What actions can I take to lower my risk of  falls? General instructions  Talk with your health care provider about your risks for falling. Tell your health care provider if: ? You fall. Be sure to tell your health care provider about all falls, even ones that seem minor. ? You feel dizzy, sleepy, or off-balance.  Take over-the-counter and prescription medicines only as told by your health care provider. These include any supplements.  Eat a healthy diet and maintain a healthy weight. A healthy diet includes low-fat dairy products, low-fat (lean) meats, and fiber from whole grains, beans, and lots of fruits and vegetables. Home safety  Remove any tripping hazards, such as rugs, cords, and clutter.  Install safety equipment such as grab bars in bathrooms and safety rails on stairs.  Keep rooms and walkways well-lit. Activity   Follow a regular exercise program to stay fit. This will help you maintain your balance. Ask your health care provider what types of exercise are appropriate for you.  If you need a cane or walker, use it as recommended by your health care provider.  Wear supportive shoes that have nonskid soles. Lifestyle  Do not drink alcohol if your health care provider tells you not to drink.  If you drink alcohol, limit how much you have: ? 0-1 drink a day for women. ? 0-2 drinks a day for men.  Be aware of how much alcohol is in your drink. In the [place] one drink equals one typical bottle of beer (12 oz), one-half glass of wine (5 oz), or one shot of hard liquor (1 oz).  Do not use any products that contain nicotine or tobacco, such as cigarettes and e-cigarettes. If you need help quitting, ask your health care provider. Summary  Having a healthy lifestyle and getting preventive care can help to protect your health and wellness after age 53.  Screening and testing are the best way to find a health problem early and help you avoid having a fall. Early diagnosis and treatment give you the best chance for  managing medical conditions that are more common for people who are older than age 47.  Falls are a major cause of broken bones and head injuries in people who are older than age 55. Take precautions to prevent a fall at home.  Work with your health care provider to learn what changes you can make to improve your health and wellness and to prevent falls. This information is not intended to replace advice given to you by your health care provider. Make sure you discuss any questions you have with your health care provider. Document  Released: 04/02/2017 Document Revised: 04/02/2017 Document Reviewed: 04/02/2017 Elsevier Interactive Patient Education  2019 [employer].

## 2018-12-01 ENCOUNTER — Other Ambulatory Visit: Payer: Self-pay

## 2018-12-01 DIAGNOSIS — Z79890 Hormone replacement therapy (postmenopausal): Secondary | ICD-10-CM

## 2018-12-01 MED ORDER — PREMPRO 0.3-1.5 MG PO TABS: ORAL_TABLET | ORAL | 4 refills | Status: DC

## 2019-06-21 ENCOUNTER — Telehealth: Payer: Self-pay | Admitting: *Deleted

## 2019-06-21 DIAGNOSIS — Z79890 Hormone replacement therapy (postmenopausal): Secondary | ICD-10-CM

## 2019-06-21 MED ORDER — PREMPRO 0.3-1.5 MG PO TABS: ORAL_TABLET | ORAL | 0 refills | Status: DC

## 2019-06-21 NOTE — Telephone Encounter (Signed)
Patient called requesting prempro 0.3-1.5 mg tablet be switched to a new pharmacy. Rx sent to pharmacy.

## 2019-09-06 ENCOUNTER — Other Ambulatory Visit: Payer: Self-pay | Admitting: Women's Health

## 2019-09-06 DIAGNOSIS — Z79890 Hormone replacement therapy (postmenopausal): Secondary | ICD-10-CM

## 2019-09-20 ENCOUNTER — Encounter: Payer: Medicare Other | Admitting: Women's Health

## 2019-09-20 ENCOUNTER — Other Ambulatory Visit: Payer: Self-pay

## 2019-09-21 ENCOUNTER — Ambulatory Visit (INDEPENDENT_AMBULATORY_CARE_PROVIDER_SITE_OTHER): Payer: Managed Care, Other (non HMO) | Admitting: Women's Health

## 2019-09-21 ENCOUNTER — Encounter: Payer: Self-pay | Admitting: Women's Health

## 2019-09-21 VITALS — BP 122/80 | Ht 65.0 in | Wt 164.0 lb

## 2019-09-21 DIAGNOSIS — Z23 Encounter for immunization: Secondary | ICD-10-CM | POA: Diagnosis not present

## 2019-09-21 DIAGNOSIS — Z79890 Hormone replacement therapy (postmenopausal): Secondary | ICD-10-CM

## 2019-09-21 DIAGNOSIS — R21 Rash and other nonspecific skin eruption: Secondary | ICD-10-CM | POA: Diagnosis not present

## 2019-09-21 DIAGNOSIS — Z01419 Encounter for gynecological examination (general) (routine) without abnormal findings: Secondary | ICD-10-CM

## 2019-09-21 MED ORDER — NYSTATIN-TRIAMCINOLONE 100000-0.1 UNIT/GM-% EX CREA: TOPICAL_CREAM | CUTANEOUS | 2 refills | Status: DC

## 2019-09-21 MED ORDER — PREMPRO 0.3-1.5 MG PO TABS
ORAL_TABLET | ORAL | 4 refills | Status: DC
Start: 2019-09-21 — End: 2019-09-21

## 2019-09-21 MED ORDER — PREMPRO 0.3-1.5 MG PO TABS
ORAL_TABLET | ORAL | 4 refills | Status: DC
Start: 2019-09-21 — End: 2020-09-25

## 2019-09-21 NOTE — Patient Instructions (Signed)
Health Maintenance After Age 71 After age 71, you are at a higher risk for certain long-term diseases and infections as well as injuries from falls. Falls are a major cause of broken bones and head injuries in people who are older than age 71. Getting regular preventive care can help to keep you healthy and well. Preventive care includes getting regular testing and making lifestyle changes as recommended by your health care provider. Talk with your health care provider about:  Which screenings and tests you should have. A screening is a test that checks for a disease when you have no symptoms.  A diet and exercise plan that is right for you. What should I know about screenings and tests to prevent falls? Screening and testing are the best ways to find a health problem early. Early diagnosis and treatment give you the best chance of managing medical conditions that are common after age 71. Certain conditions and lifestyle choices may make you more likely to have a fall. Your health care provider may recommend:  Regular vision checks. Poor vision and conditions such as cataracts can make you more likely to have a fall. If you wear glasses, make sure to get your prescription updated if your vision changes.  Medicine review. Work with your health care provider to regularly review all of the medicines you are taking, including over-the-counter medicines. Ask your health care provider about any side effects that may make you more likely to have a fall. Tell your health care provider if any medicines that you take make you feel dizzy or sleepy.  Osteoporosis screening. Osteoporosis is a condition that causes the bones to get weaker. This can make the bones weak and cause them to break more easily.  Blood pressure screening. Blood pressure changes and medicines to control blood pressure can make you feel dizzy.  Strength and balance checks. Your health care provider may recommend certain tests to check your  strength and balance while standing, walking, or changing positions.  Foot health exam. Foot pain and numbness, as well as not wearing proper footwear, can make you more likely to have a fall.  Depression screening. You may be more likely to have a fall if you have a fear of falling, feel emotionally low, or feel unable to do activities that you used to do.  Alcohol use screening. Using too much alcohol can affect your balance and may make you more likely to have a fall. What actions can I take to lower my risk of falls? General instructions  Talk with your health care provider about your risks for falling. Tell your health care provider if: ? You fall. Be sure to tell your health care provider about all falls, even ones that seem minor. ? You feel dizzy, sleepy, or off-balance.  Take over-the-counter and prescription medicines only as told by your health care provider. These include any supplements.  Eat a healthy diet and maintain a healthy weight. A healthy diet includes low-fat dairy products, low-fat (lean) meats, and fiber from whole grains, beans, and lots of fruits and vegetables. Home safety  Remove any tripping hazards, such as rugs, cords, and clutter.  Install safety equipment such as grab bars in bathrooms and safety rails on stairs.  Keep rooms and walkways well-lit. Activity   Follow a regular exercise program to stay fit. This will help you maintain your balance. Ask your health care provider what types of exercise are appropriate for you.  If you need a cane or   walker, use it as recommended by your health care provider.  Wear supportive shoes that have nonskid soles. Lifestyle  Do not drink alcohol if your health care provider tells you not to drink.  If you drink alcohol, limit how much you have: ? 0-1 drink a day for women. ? 0-2 drinks a day for men.  Be aware of how much alcohol is in your drink. In the [place] one drink equals one typical bottle of beer (12  oz), one-half glass of wine (5 oz), or one shot of hard liquor (1 oz).  Do not use any products that contain nicotine or tobacco, such as cigarettes and e-cigarettes. If you need help quitting, ask your health care provider. Summary  Having a healthy lifestyle and getting preventive care can help to protect your health and wellness after age 71.  Screening and testing are the best way to find a health problem early and help you avoid having a fall. Early diagnosis and treatment give you the best chance for managing medical conditions that are more common for people who are older than age 71.  Falls are a major cause of broken bones and head injuries in people who are older than age 71. Take precautions to prevent a fall at home.  Work with your health care provider to learn what changes you can make to improve your health and wellness and to prevent falls. This information is not intended to replace advice given to you by your health care provider. Make sure you discuss any questions you have with your health care provider. Document Revised: 09/10/2018 Document Reviewed: 04/02/2017 Elsevier Patient Education  2020 Elsevier Inc.  

## 2019-09-21 NOTE — Addendum Note (Signed)
Addended by: Tito Dine on: 09/21/2019 11:21 AM   Modules accepted: Orders

## 2019-09-21 NOTE — Progress Notes (Signed)
Katrina Gordon Nov 30, 1950 [id number]   History:  69 y.o. D WF G4, P2 presents for annual exam with no complaints.  Postmenopausal on HRT with no bleeding.  Normal Pap and mammogram history.  Biggest problem is arthritis has had 2 hip replacements.  2013 T score -1.3, declines follow-up.  Has never had a screening colonoscopy has had negative Cologuard's.  Not sexually active.  Gynecologic History Patient's last menstrual period was 06/03/1994.   Past medical history, past surgical history, family history and social history were all reviewed and documented in the EPIC chart.  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  2 daughters both live in [place] recently moved to [place], 1 daughter is having a baby this year.  ROS:  A ROS was performed and pertinent positives and negatives are included.  Exam:  Vitals:   09/21/19 0900  BP: 122/80  Weight: 164 lb (74.4 kg)  Height: 5\' 5"  (1.651 m)   Body mass index is 27.29 kg/m.  General appearance:  Normal Thyroid:  Symmetrical, normal in size, without palpable masses or nodularity. Respiratory  Auscultation:  Clear without wheezing or rhonchi Cardiovascular  Auscultation:  Regular rate, without rubs, murmurs or gallops  Edema/varicosities:  Not grossly evident Abdominal  Soft,nontender, without masses, guarding or rebound.  Liver/spleen:  No organomegaly noted  Hernia:  None appreciated  Skin  Inspection:  Grossly normal   Breasts: Examined lying and sitting.   Right: Without masses, retractions, discharge or axillary adenopathy.   Left: Without masses, retractions, discharge or axillary adenopathy. Gentitourinary   Inguinal/mons:  Normal without inguinal adenopathy  External genitalia:  Normal  BUS/Urethra/Skene's glands:  Normal  Vagina:  Normal  Cervix:  Normal  Uterus:   normal in size, shape and contour.  Midline and mobile  Adnexa/parametria:     Rt: Without  masses or tenderness.   Lt: Without masses or tenderness.  Anus and perineum: Normal  Digital rectal exam: Normal sphincter tone without palpated masses or tenderness  Assessment/Plan:  69 y.o. D WF G4 P2 for annual exam with no complaints of vaginal discharge, urinary symptoms, or abdominal pain.    Postmenopausal on Prempro 0.3/1.5 takes every other day Osteoarthritis rheumatologist manages Labs-primary care  Plan: HRT risks of blood clots, strokes and breast cancer reviewed states cannot tolerate being off will continue with Prempro 0.3/1.5, takes it every other day.  SBEs, continue annual 3D screening mammogram, calcium rich foods, vitamin D 2000 IUs daily.  Reviewed importance of continuing active lifestyle with this of continuing active lifestyle with regular exercise.  Balance type exercise such as yoga encouraged.  Pap screening guidelines reviewed.  Aware colonoscopy is gold standard declines.  Shingrix vaccine encouraged.  Refill of Mycolog given.  Uses sparingly.    02-22-1984 [facility], 10:26 AM 09/21/2019

## 2019-09-22 LAB — URINALYSIS, COMPLETE W/RFL CULTURE
Bacteria, UA: NONE SEEN /HPF
Bilirubin Urine: NEGATIVE
Glucose, UA: NEGATIVE
Hgb urine dipstick: NEGATIVE
Hyaline Cast: NONE SEEN /LPF
Ketones, ur: NEGATIVE
Leukocyte Esterase: NEGATIVE
Nitrites, Initial: NEGATIVE
Protein, ur: NEGATIVE
RBC / HPF: NONE SEEN /HPF (ref 0–2)
Specific Gravity, Urine: 1.02 (ref 1.001–1.03)
Squamous Epithelial / LPF: NONE SEEN /HPF (ref <=5)
WBC, UA: NONE SEEN /HPF (ref 0–5)
pH: 5.5 (ref 5.0–8.0)

## 2019-09-22 LAB — NO CULTURE INDICATED

## 2020-07-27 ENCOUNTER — Other Ambulatory Visit: Payer: Self-pay | Admitting: Orthopedic Surgery

## 2020-07-27 DIAGNOSIS — Z96642 Presence of left artificial hip joint: Secondary | ICD-10-CM

## 2020-08-06 ENCOUNTER — Other Ambulatory Visit: Payer: Self-pay

## 2020-08-19 ENCOUNTER — Other Ambulatory Visit: Payer: Self-pay

## 2020-08-19 ENCOUNTER — Ambulatory Visit
Admission: RE | Admit: 2020-08-19 | Discharge: 2020-08-19 | Disposition: A | Discharge status: Home / Self Care | Payer: Managed Care, Other (non HMO) | Source: Ambulatory Visit | Attending: Orthopedic Surgery | Admitting: Orthopedic Surgery

## 2020-08-19 DIAGNOSIS — Z96642 Presence of left artificial hip joint: Secondary | ICD-10-CM

## 2020-08-26 ENCOUNTER — Other Ambulatory Visit: Payer: Self-pay

## 2020-09-25 ENCOUNTER — Other Ambulatory Visit: Payer: Self-pay

## 2020-09-25 ENCOUNTER — Ambulatory Visit (INDEPENDENT_AMBULATORY_CARE_PROVIDER_SITE_OTHER): Payer: Managed Care, Other (non HMO) | Admitting: Nurse Practitioner

## 2020-09-25 ENCOUNTER — Encounter: Payer: Self-pay | Admitting: Nurse Practitioner

## 2020-09-25 VITALS — BP 124/76 | Ht 65.0 in | Wt 164.0 lb

## 2020-09-25 DIAGNOSIS — Z79890 Hormone replacement therapy (postmenopausal): Secondary | ICD-10-CM | POA: Diagnosis not present

## 2020-09-25 DIAGNOSIS — M8588 Other specified disorders of bone density and structure, other site: Secondary | ICD-10-CM

## 2020-09-25 DIAGNOSIS — R9389 Abnormal findings on diagnostic imaging of other specified body structures: Secondary | ICD-10-CM | POA: Diagnosis not present

## 2020-09-25 DIAGNOSIS — Z01419 Encounter for gynecological examination (general) (routine) without abnormal findings: Secondary | ICD-10-CM | POA: Diagnosis not present

## 2020-09-25 DIAGNOSIS — R21 Rash and other nonspecific skin eruption: Secondary | ICD-10-CM | POA: Diagnosis not present

## 2020-09-25 MED ORDER — NYSTATIN-TRIAMCINOLONE 100000-0.1 UNIT/GM-% EX CREA: TOPICAL_CREAM | CUTANEOUS | 2 refills | Status: DC

## 2020-09-25 MED ORDER — PREMPRO 0.3-1.5 MG PO TABS: ORAL_TABLET | ORAL | 2 refills | Status: DC

## 2020-09-25 NOTE — Patient Instructions (Signed)
Health Maintenance After Age 72 After age 72, you are at a higher risk for certain long-term diseases and infections as well as injuries from falls. Falls are a major cause of broken bones and head injuries in people who are older than age 72. Getting regular preventive care can help to keep you healthy and well. Preventive care includes getting regular testing and making lifestyle changes as recommended by your health care provider. Talk with your health care provider about:  Which screenings and tests you should have. A screening is a test that checks for a disease when you have no symptoms.  A diet and exercise plan that is right for you. What should I know about screenings and tests to prevent falls? Screening and testing are the best ways to find a health problem early. Early diagnosis and treatment give you the best chance of managing medical conditions that are common after age 72. Certain conditions and lifestyle choices may make you more likely to have a fall. Your health care provider may recommend:  Regular vision checks. Poor vision and conditions such as cataracts can make you more likely to have a fall. If you wear glasses, make sure to get your prescription updated if your vision changes.  Medicine review. Work with your health care provider to regularly review all of the medicines you are taking, including over-the-counter medicines. Ask your health care provider about any side effects that may make you more likely to have a fall. Tell your health care provider if any medicines that you take make you feel dizzy or sleepy.  Osteoporosis screening. Osteoporosis is a condition that causes the bones to get weaker. This can make the bones weak and cause them to break more easily.  Blood pressure screening. Blood pressure changes and medicines to control blood pressure can make you feel dizzy.  Strength and balance checks. Your health care provider may recommend certain tests to check your  strength and balance while standing, walking, or changing positions.  Foot health exam. Foot pain and numbness, as well as not wearing proper footwear, can make you more likely to have a fall.  Depression screening. You may be more likely to have a fall if you have a fear of falling, feel emotionally low, or feel unable to do activities that you used to do.  Alcohol use screening. Using too much alcohol can affect your balance and may make you more likely to have a fall. What actions can I take to lower my risk of falls? General instructions  Talk with your health care provider about your risks for falling. Tell your health care provider if: ? You fall. Be sure to tell your health care provider about all falls, even ones that seem minor. ? You feel dizzy, sleepy, or off-balance.  Take over-the-counter and prescription medicines only as told by your health care provider. These include any supplements.  Eat a healthy diet and maintain a healthy weight. A healthy diet includes low-fat dairy products, low-fat (lean) meats, and fiber from whole grains, beans, and lots of fruits and vegetables. Home safety  Remove any tripping hazards, such as rugs, cords, and clutter.  Install safety equipment such as grab bars in bathrooms and safety rails on stairs.  Keep rooms and walkways well-lit. Activity  Follow a regular exercise program to stay fit. This will help you maintain your balance. Ask your health care provider what types of exercise are appropriate for you.  If you need a cane or walker,   use it as recommended by your health care provider.  Wear supportive shoes that have nonskid soles.   Lifestyle  Do not drink alcohol if your health care provider tells you not to drink.  If you drink alcohol, limit how much you have: ? 0-1 drink a day for women. ? 0-2 drinks a day for men.  Be aware of how much alcohol is in your drink. In the [place] one drink equals one typical bottle of beer (12  oz), one-half glass of wine (5 oz), or one shot of hard liquor (1 oz).  Do not use any products that contain nicotine or tobacco, such as cigarettes and e-cigarettes. If you need help quitting, ask your health care provider. Summary  Having a healthy lifestyle and getting preventive care can help to protect your health and wellness after age 72.  Screening and testing are the best way to find a health problem early and help you avoid having a fall. Early diagnosis and treatment give you the best chance for managing medical conditions that are more common for people who are older than age 72.  Falls are a major cause of broken bones and head injuries in people who are older than age 72. Take precautions to prevent a fall at home.  Work with your health care provider to learn what changes you can make to improve your health and wellness and to prevent falls. This information is not intended to replace advice given to you by your health care provider. Make sure you discuss any questions you have with your health care provider. Document Revised: 09/10/2018 Document Reviewed: 04/02/2017 Elsevier Patient Education  2021 Elsevier Inc.  

## 2020-09-25 NOTE — Progress Notes (Signed)
Katrina Gordon [birth year] [id number]   History:  70 y.o. [id number] presents for breast and pelvic exam. Postmenopausal - on HRT taking Prempro every other day. Sometimes she misses a couple of days and experiences significant hot flashes. 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 MRI also showed thickened endometrium of 14 mm. She is very active with swimming and resistance training. Normal pap and mammogram history. Mild osteopenia.   Gynecologic History Patient's last menstrual period was 06/03/1994.   Contraception/Family planning: post menopausal status  Health Maintenance Last Pap: No longer screening per guidelines Last mammogram: 2020 per patient. Results were: normal Last colonoscopy: Never. Negative cologuards Last Dexa: 2013. Results were: T-score -1.2  Past medical history, past surgical history, family history and social history were all reviewed and documented in the EPIC chart.   ROS:  A ROS was performed and pertinent positives and negatives are included.  Exam:  Vitals:   09/25/20 1005  BP: 124/76  Weight: 164 lb (74.4 kg)  Height: 5\' 5"  (1.651 m)   Body mass index is 27.29 kg/m.  General appearance:  Normal Thyroid:  Symmetrical, normal in size, without palpable masses or nodularity. Respiratory  Auscultation:  Clear without wheezing or rhonchi Cardiovascular  Auscultation:  Regular rate, without rubs, murmurs or gallops  Edema/varicosities:  Not grossly evident Abdominal  Soft,nontender, without masses, guarding or rebound.  Liver/spleen:  No organomegaly noted  Hernia:  None appreciated  Skin  Inspection:  Grossly normal Breasts: Examined lying and sitting.   Right: Without masses, retractions, nipple discharge or axillary adenopathy.   Left: Without masses, retractions, nipple discharge or axillary adenopathy. Gentitourinary   Inguinal/mons:  Normal without inguinal adenopathy  External  genitalia:  Normal appearing vulva with no masses, tenderness, or lesions  BUS/Urethra/Skene's glands:  Normal  Vagina:  Normal appearing with normal color and discharge, no lesions  Cervix:  Normal appearing without discharge or lesions  Uterus:  Normal in size, shape and contour.  Midline and mobile, nontender  Adnexa/parametria:     Rt: Normal in size, without masses or tenderness.   Lt: Normal in size, without masses or tenderness.  Anus and perineum: Normal  Digital rectal exam: Normal sphincter tone without palpated masses or tenderness  Assessment/Plan:  70 y.o. 62 for annual exam.   Well female exam with routine gynecological exam - Education provided on SBEs, importance of preventative screenings, current guidelines, high calcium diet, regular exercise, and multivitamin daily. Labs with PCP.   Osteopenia of spine - T-score - -1.2 in 2013. She declines further screenings at this time. She is very active with swimming and resistance training.   Hormone replacement therapy (HRT) - Plan: PREMPRO 0.3-1.5 MG tablet every other day. She has tried to wean multiple times and occasionally misses a dose and experiences significant hot flashes. She is aware of the risks of blood clots, heart attack, stroke, and breast cancer. She would like to continue. Refill x 1 year provided.   Rash and nonspecific skin eruption - Plan: nystatin-triamcinolone (MYCOLOG II) cream. She develops groin redness often from sweating and says this is the only thing that helps.   Thickened endometrium, asymptomatic - She brought MRI report that shows thickened endometrium of 14 mm. Pelvic ultrasound/endometrial biopsy highly recommended but she does not want to schedule at this time.   Screening for cervical cancer - Normal Pap history.  No longer screening per guidelines.   Screening for breast cancer -  Normal mammogram history.  She is overdue for mammogram and plans to schedule this soon. Normal breast exam  today.  Screening for colon cancer - Negative Cologuard this year.  Return in 1 year for annual.    Olivia Mackie DNP, 10:26 AM 09/25/2020

## 2020-10-24 ENCOUNTER — Encounter: Payer: Self-pay | Admitting: Nurse Practitioner

## 2021-09-25 NOTE — Progress Notes (Signed)
? ?Katrina Gordon November 17, 1950 [id number] ? ? ?History:  71 y.o. [id number] presents for breast and pelvic exam. Postmenopausal - on HRT taking Prempro every other day. Sometimes she misses a couple of days and experiences significant hot flashes. MRI last year also showed thickened endometrium of 14 mm, she declined  any further workup. Normal pap and mammogram history.  She is very active with swimming and resistance training.  ? ?Gynecologic History ?Patient's last menstrual period was 06/03/1994. ?  ?Contraception/Family planning: post menopausal status ?Sexually active: No ? ?Health Maintenance ?Last Pap: 03/11/2014. Results were: Normal ?Last mammogram: 10/24/2020. Results were: Normal ?Last colonoscopy: Never. Negative Cologuard 2022 ?Last Dexa: 2013. Results were: T-score -1.2 ? ?Past medical history, past surgical history, family history and social history were all reviewed and documented in the EPIC chart. Retired December 2022. 18 mo granddaughter. 2 daughters live near [place]. [facility] at [facility]. ? ?ROS:  A ROS was performed and pertinent positives and negatives are included. ? ?Exam: ? ?Vitals:  ? 09/26/21 1001  ?BP: 124/78  ?Weight: 163 lb (73.9 kg)  ?Height: 5' 4.5" (1.638 m)  ? ? ?Body mass index is 27.55 kg/m?. ? ?General appearance:  Normal ?Thyroid:  Symmetrical, normal in size, without palpable masses or nodularity. ?Respiratory ? Auscultation:  Clear without wheezing or rhonchi ?Cardiovascular ? Auscultation:  Regular rate, without rubs, murmurs or gallops ? Edema/varicosities:  Not grossly evident ?Abdominal ? Soft,nontender, without masses, guarding or rebound. ? Liver/spleen:  No organomegaly noted ? Hernia:  None appreciated ? Skin ? Inspection:  Grossly normal ?Breasts: Examined lying and sitting.  ? Right: Without masses, retractions, nipple discharge or axillary adenopathy. ? ? Left: Without masses, retractions, nipple discharge or axillary adenopathy. ?Gentitourinary  ? Inguinal/mons:  Normal  without inguinal adenopathy ? External genitalia:  Normal appearing vulva with no masses, tenderness, or lesions ? BUS/Urethra/Skene's glands:  Normal ? Vagina:  Normal appearing with normal color and discharge, no lesions ? Cervix:  Normal appearing without discharge or lesions ? Uterus:  Normal in size, shape and contour.  Midline and mobile, nontender ? Adnexa/parametria:   ?  Rt: Normal in size, without masses or tenderness. ?  Lt: Normal in size, without masses or tenderness. ? Anus and perineum: Normal ? Digital rectal exam: Normal sphincter tone without palpated masses or tenderness ? ?Patient informed chaperone available to be present for breast and pelvic exam. Patient has requested no chaperone to be present. Patient has been advised what will be completed during breast and pelvic exam.  ? ?Assessment/Plan:  71 y.o. [id number] for annual exam.  ? ?Well female exam with routine gynecological exam - Education provided on SBEs, importance of preventative screenings, current guidelines, high calcium diet, regular exercise, and multivitamin daily.  Labs with PCP.  ? ?Postmenopausal hormone therapy - Plan: PREMPRO 0.3-1.5 MG tablet daily (she is taking every other day). Aware of risk with continued use. She occasionally misses doses and has significant symptoms. Refill x 1 year provided.  ? ?Osteopenia of spine - Declines further screenings. Continue Vitamin D and exercise. She is very active with swimming and resistance training.  ? ?Thickened endometrium - Plan: US PELVIS TRANSVAGINAL NON-OB (TV ONLY). No bleeding. Was seen on MRI last year - 14 mm - with recommendations to follow up for ultrasound and EMB but she declined at that time. She is agreeable to ultrasound now but unsure about EMB. We had discussed on importance of evaluating.  ? ?Rash and nonspecific skin eruption - Plan:  nystatin-triamcinolone (MYCOLOG II) cream BID as needed.  ? ?Screening for cervical cancer - Normal Pap history.  No longer  screening per guidelines.  ? ?Screening for breast cancer - Normal mammogram history.  Continue annual screenings. Normal breast exam today. ? ?Screening for colon cancer - Negative Cologuard 2022.  ? ?Return in 1 year for medication follow up.  ? ? ? ? ?Olivia Mackie DNP, 10:58 AM 09/26/2021 ? ?

## 2021-09-26 ENCOUNTER — Ambulatory Visit (INDEPENDENT_AMBULATORY_CARE_PROVIDER_SITE_OTHER): Payer: Medicare Other | Admitting: Nurse Practitioner

## 2021-09-26 ENCOUNTER — Encounter: Payer: Self-pay | Admitting: Nurse Practitioner

## 2021-09-26 VITALS — BP 124/78 | Ht 64.5 in | Wt 163.0 lb

## 2021-09-26 DIAGNOSIS — Z79890 Hormone replacement therapy (postmenopausal): Secondary | ICD-10-CM | POA: Diagnosis not present

## 2021-09-26 DIAGNOSIS — M8588 Other specified disorders of bone density and structure, other site: Secondary | ICD-10-CM | POA: Diagnosis not present

## 2021-09-26 DIAGNOSIS — R21 Rash and other nonspecific skin eruption: Secondary | ICD-10-CM | POA: Diagnosis not present

## 2021-09-26 DIAGNOSIS — Z01419 Encounter for gynecological examination (general) (routine) without abnormal findings: Secondary | ICD-10-CM

## 2021-09-26 DIAGNOSIS — R9389 Abnormal findings on diagnostic imaging of other specified body structures: Secondary | ICD-10-CM | POA: Diagnosis not present

## 2021-09-26 MED ORDER — NYSTATIN-TRIAMCINOLONE 100000-0.1 UNIT/GM-% EX CREA: TOPICAL_CREAM | Freq: Two times a day (BID) | CUTANEOUS | 0 refills | Status: AC

## 2021-09-26 MED ORDER — PREMPRO 0.3-1.5 MG PO TABS: 1.0000 | ORAL_TABLET | Freq: Every day | ORAL | 3 refills | Status: AC

## 2021-10-25 ENCOUNTER — Ambulatory Visit (INDEPENDENT_AMBULATORY_CARE_PROVIDER_SITE_OTHER): Payer: Medicare Other

## 2021-10-25 ENCOUNTER — Ambulatory Visit: Payer: Medicare Other | Admitting: Obstetrics & Gynecology

## 2021-10-25 ENCOUNTER — Encounter: Payer: Self-pay | Admitting: Obstetrics & Gynecology

## 2021-10-25 VITALS — BP 140/90

## 2021-10-25 DIAGNOSIS — Z79890 Hormone replacement therapy (postmenopausal): Secondary | ICD-10-CM

## 2021-10-25 DIAGNOSIS — R9389 Abnormal findings on diagnostic imaging of other specified body structures: Secondary | ICD-10-CM | POA: Diagnosis not present

## 2021-10-25 NOTE — Progress Notes (Signed)
Katrina Gordon 12/02/1950 [id number]        71 y.o.  [id number]   RP: Thickened Endometrium on MRI last year for Pelvic US  HPI: Postmenopause on PREMPRO 0.3-1.5 MG tablet every other day.  Aware of risk with continued use. She occasionally misses doses and has significant symptoms. No PMB.  Thickened endometrium on MRI last year at 14 mm, done by Ortho to evaluate her hips.  Osteopenia of spine - Declines further screenings. Continue Vitamin D and exercise. She is very active with swimming and resistance training.     OB History  Gravida Para Term Preterm AB Living  4 2 2   2 2   SAB IAB Ectopic Multiple Live Births               # Outcome Date GA Lbr Len/2nd Weight Sex Delivery Anes PTL Lv  4 AB           3 AB           2 Term           1 Term             Past medical history,surgical history, problem list, medications, allergies, family history and social history were all reviewed and documented in the EPIC chart.   Directed ROS with pertinent positives and negatives documented in the history of present illness/assessment and plan.  Exam:  Vitals:   10/25/21 1439  BP: 140/90   General appearance:  Normal  Pelvic 10/27/21 today: T/V images.  Anteverted atrophic uterus with no myometrial mass.  The overall uterine size is measured at 5.7 x 3.77 x 2.58 cm.  The endometrial lining is thickened with avascular endometrium measured at 9.7 mm.  No definitive vascularity identified.  Both ovaries are atrophic in appearance.  No adnexal mass.  No free fluid in the pelvis.   Assessment/Plan:  71 y.o. 59   1. Thickened endometrium Postmenopause on PREMPRO 0.3-1.5 MG tablet every other day.  Aware of risk with continued use. She occasionally misses doses and has significant symptoms. No PMB.  Thickened endometrium on MRI last year at 14 mm, done by Ortho to evaluate her hips.  Osteopenia of spine - Declines further screenings. Continue Vitamin D and exercise. She is very active with  swimming and resistance training.  Pelvic [id number] findings thoroughly reviewed with patient.  Thickened Endometrium at 9.71 mm.  Cannot R/O Endometrial Polyp, Endometrial Hyperplasia or Endometrial Cancer.  A Sonohysterogramm with an Endometrial Biopsy is strongly recommended.  Patient was recommended an Endometrial Biopsy today, which she declined.  If no tissue sampling is done to r/o pre-cancer or cancer of the endometrium, strongly recommend stopping the HRT.  The new FDA approved medication Veozah for Hot Flushes was discussed.  Patient desires time to inform herself and then decide on management.  We will call patient in 2 weeks to inquire about her decision.     2. Postmenopausal hormone therapy Recommend stopping HRT at this time as the Endometrial lining is thick at 9.7 mm and patient declined Endometrial Biopsy today. If patient stops HRT and controls her vasomotor menopausal Sxs with Veozah, and still declines EBx, could repeat a Pelvic US in 3 months to reassess the Endometrial line  Other orders - triamcinolone cream (KENALOG) 0.1 %; Apply topically. - MAGNESIUM PO; Take by mouth.   Counseling and management of thickened Endometrial Line and HRT for 15 minutes.  [place] MD, 2:50 PM 10/25/2021

## 2021-11-09 ENCOUNTER — Telehealth: Payer: Self-pay

## 2021-11-09 DIAGNOSIS — R9389 Abnormal findings on diagnostic imaging of other specified body structures: Secondary | ICD-10-CM

## 2021-11-09 NOTE — Telephone Encounter (Signed)
-----   Message from Princess Bruins, MD sent at 10/25/2021  3:32 PM EDT ----- Regarding: Call patient on June 8th at the latest to organize further investigation/management Thickened Endometrial line on HRT.  Needs an EBx or Sonohysto/EBx if remains on HRT.  If stops HRT, still recommend EBx or Sonohysto/EBx.  Alternatively, if stops HRT, could reassess the Endometrial line in 3 months by Pelvic US.

## 2021-11-09 NOTE — Telephone Encounter (Signed)
LDVMOM per DPR. 

## 2021-11-16 NOTE — Telephone Encounter (Signed)
LVMTCB

## 2021-11-28 NOTE — Telephone Encounter (Signed)
11/23/21-LM to schedule 11/27/21-pt to call back to schedule 11/28/21-per [facility] "Pt did not return call."

## 2021-12-06 NOTE — Telephone Encounter (Signed)
Please advise on how you wish to proceed. Thanks.

## 2021-12-11 NOTE — Telephone Encounter (Signed)
Per ML: "Do not refill any HRT.  No need to attempt further to schedule Sonohysto."  Will sign and close.

## 2021-12-25 ENCOUNTER — Other Ambulatory Visit: Payer: Self-pay | Admitting: *Deleted

## 2021-12-25 DIAGNOSIS — R9389 Abnormal findings on diagnostic imaging of other specified body structures: Secondary | ICD-10-CM

## 2022-01-06 LAB — COLOGUARD: COLOGUARD: NEGATIVE

## 2022-01-06 LAB — EXTERNAL GENERIC LAB PROCEDURE: COLOGUARD: NEGATIVE

## 2022-01-29 ENCOUNTER — Ambulatory Visit (INDEPENDENT_AMBULATORY_CARE_PROVIDER_SITE_OTHER): Payer: Medicare Other | Admitting: Obstetrics & Gynecology

## 2022-01-29 ENCOUNTER — Encounter: Payer: Self-pay | Admitting: Obstetrics & Gynecology

## 2022-01-29 ENCOUNTER — Ambulatory Visit: Payer: Medicare Other

## 2022-01-29 VITALS — BP 120/82

## 2022-01-29 DIAGNOSIS — R9389 Abnormal findings on diagnostic imaging of other specified body structures: Secondary | ICD-10-CM

## 2022-01-29 DIAGNOSIS — N9489 Other specified conditions associated with female genital organs and menstrual cycle: Secondary | ICD-10-CM

## 2022-01-29 DIAGNOSIS — Z79890 Hormone replacement therapy (postmenopausal): Secondary | ICD-10-CM | POA: Diagnosis not present

## 2022-01-29 MED ORDER — IBUPROFEN 200 MG PO TABS
800.0000 mg | ORAL_TABLET | Freq: Once | ORAL | Status: AC
  Administered 2022-01-29: 800 mg via ORAL

## 2022-01-29 NOTE — Progress Notes (Signed)
Katrina Gordon 09/05/1950 [id number]        71 y.o.  [id number]   RP: Thickened Endometrium for Sonohysto/EBx  HPI: Thickened Endometrial line on MRI 08/2020.  Pelvic US 10/2021 showed a thickened Endometrium at 9.71 mm.  Patient declined the strongly recommended EBx.  Still on Prempro after being informed of the risks of HRT at this time in her life and with a thickened endometrium.  No PMB.  No pelvic pain.   OB History  Gravida Para Term Preterm AB Living  4 2 2   2 2   SAB IAB Ectopic Multiple Live Births               # Outcome Date GA Lbr Len/2nd Weight Sex Delivery Anes PTL Lv  4 AB           3 AB           2 Term           1 Term             Past medical history,surgical history, problem list, medications, allergies, family history and social history were all reviewed and documented in the EPIC chart.   Directed ROS with pertinent positives and negatives documented in the history of present illness/assessment and plan.  Exam:  There were no vitals filed for this visit. General appearance:  Normal                                                                    Sono Infusion Hysterogram ( procedure note)   The initial transvaginal ultrasound demonstrated the following:  Endometrial thickness at 10.8 mm.  The speculum  was inserted and the cervix cleansed with Betadine solution after confirming that patient has no allergies.A small sonohysterography catheterwas utilized.  Insertion was facilitated with ring forceps, using a spear-like motion the catheter was inserted to the fundus of the uterus. The speculum is then removed carefully to avoid dislodging the catheter. The catheter was flushed with sterile saline delete prior to insertion to rid it of small amounts of air.the sterile saline solution was infused into the uterine cavity as a vaginal ultrasound probe was then placed in the vagina for full visualization of the uterine cavity from a transvaginal approach. The  following was noted:  Sonohysterogram performed with saline.  Intracavitary mass identified measuring 1.8 x 0.8 x 1.4 cm.  Successful sonohysterogram.  Endometrial biopsy refused by patient after counseling about it and recommending it.  The catheter was then removed after retrieving some of the saline from the intrauterine cavity. Endometrial biopsy strongly recommended, but declined by patient. Patient tolerated procedure well but had a strong uterine cramp.  No Cx.     Assessment/Plan:  71 y.o. 71   1. Thickened endometrium Thickened Endometrial line on MRI 08/2020.  Pelvic 09/2020 10/2021 showed a thickened Endometrium at 9.71 mm.  Patient declined the strongly recommended EBx.  Still on Prempro after being informed of the risks of HRT at this time in her life and with a thickened endometrium.  No PMB.  No pelvic pain.  SonoHysto findings thoroughly reviewed with patient.    2. Endometrial mass Sonohysterogram showing an intracavitary mass measured at 1.8 x 0.8 x 1.4 cm.  Patient declined EBx after counseling and recommending it.  Decision to proceed with HSC/D+C/Myosure Excision.  Pamphlet and information given.  F/U Preop.    3. Postmenopausal hormone therapy Strongly recommend stopping Prempro now.  Patient explained that Endometrial cells, Endometrial Polyps, Endometrial Hyperplasia and Endometrial Cancer would all likely be stimulated by Prempro.  Also increasing the risks of DVT/PE/Stroke in the setting of a surgery.  Other orders - meloxicam (MOBIC) 15 MG tablet; Take 15 mg by mouth daily.   [name] MD, 3:47 PM 01/29/2022

## 2022-01-30 ENCOUNTER — Telehealth: Payer: Self-pay | Admitting: *Deleted

## 2022-01-30 NOTE — Telephone Encounter (Signed)
Left message to call Glady Ouderkirk, RN at [facility], [phone].  

## 2022-01-30 NOTE — Telephone Encounter (Signed)
-----   Message from [name], MD sent at 01/29/2022  4:44 PM EDT ----- Regarding: Schedule surgery Surgery: CPT 58558 - Hysteroscopy/D&C/Myosure  Diagnosis: R93.89 Thickened Endometrium, N84.0 Endometrial Polyp  Location: [facility]  Status: Outpatient  Time: 30 Minutes  Assistant: N/A  Urgency: First Available  Pre-Op Appointment: To Be Scheduled  Post-Op Appointment(s): 2 Weeks  Time Out Of Work: Day Of Surgery ONLY

## 2022-02-13 ENCOUNTER — Encounter: Payer: Self-pay | Admitting: Nurse Practitioner

## 2022-02-21 NOTE — Telephone Encounter (Signed)
Left message to call Finola Rosal, RN at [facility], [phone].  

## 2022-02-26 NOTE — Telephone Encounter (Signed)
My Chart message sent

## 2022-02-27 NOTE — Telephone Encounter (Signed)
Zerina Sigel to Me   DR    02/26/22  5:04 PM Thank you for reaching out I will not be following up  [place] message back to patient.   Routing to Dr. Ileene Musa.   Encounter closed.

## 2022-02-27 NOTE — Telephone Encounter (Signed)
Katrina Bruins, MD  You 55 minutes ago (9:41 AM)    Recommend a referral to Gyn-Onco or another Gynecologist to r/o endometrial cancer.    MyChart message to patient.

## 2022-03-26 NOTE — Telephone Encounter (Signed)
No response from patient.   Letter pended and copy to Dr. Dellis Filbert to review and sign.

## 2022-03-26 NOTE — Telephone Encounter (Signed)
Letter reviewed and signed by Dr. Dellis Filbert.  Letter mailed to address on file.   Routing to provider for final review.  Will close encounter.

## 2022-06-20 ENCOUNTER — Other Ambulatory Visit: Payer: Self-pay | Admitting: Nurse Practitioner

## 2022-06-20 DIAGNOSIS — Z79890 Hormone replacement therapy (postmenopausal): Secondary | ICD-10-CM

## 2023-02-02 HISTORY — PX: KNEE ARTHROPLASTY: SHX992

## 2023-09-17 ENCOUNTER — Other Ambulatory Visit (HOSPITAL_COMMUNITY): Payer: Self-pay | Admitting: Orthopedic Surgery

## 2023-09-17 DIAGNOSIS — M25552 Pain in left hip: Secondary | ICD-10-CM

## 2023-10-17 ENCOUNTER — Encounter (HOSPITAL_COMMUNITY)
Admission: RE | Admit: 2023-10-17 | Discharge: 2023-10-17 | Disposition: A | Discharge status: Home / Self Care | Source: Ambulatory Visit | Attending: Orthopedic Surgery | Admitting: Orthopedic Surgery

## 2023-10-17 DIAGNOSIS — M25552 Pain in left hip: Secondary | ICD-10-CM | POA: Insufficient documentation

## 2023-10-17 DIAGNOSIS — M25551 Pain in right hip: Secondary | ICD-10-CM | POA: Diagnosis present

## 2023-10-17 MED ORDER — TECHNETIUM TC 99M MEDRONATE IV KIT
20.0000 | PACK | Freq: Once | INTRAVENOUS | Status: AC | PRN
  Administered 2023-10-17: 20 via INTRAVENOUS

## 2023-10-31 ENCOUNTER — Other Ambulatory Visit: Payer: Self-pay | Admitting: Orthopedic Surgery

## 2023-10-31 DIAGNOSIS — Z96641 Presence of right artificial hip joint: Secondary | ICD-10-CM

## 2023-11-06 ENCOUNTER — Ambulatory Visit
Admission: RE | Admit: 2023-11-06 | Discharge: 2023-11-06 | Disposition: A | Discharge status: Home / Self Care | Source: Ambulatory Visit | Attending: Orthopedic Surgery | Admitting: Orthopedic Surgery

## 2023-11-06 DIAGNOSIS — Z96641 Presence of right artificial hip joint: Secondary | ICD-10-CM

## 2023-12-25 NOTE — Progress Notes (Signed)
 Sent message, via epic in basket, requesting orders in epic from Careers adviser.

## 2023-12-29 ENCOUNTER — Other Ambulatory Visit: Payer: Self-pay | Admitting: Orthopedic Surgery

## 2023-12-31 NOTE — Patient Instructions (Addendum)
 SURGICAL WAITING ROOM VISITATION Patients having surgery or a procedure may have no more than 2 support people in the waiting area - these visitors may rotate in the visitor waiting room.   If the patient needs to stay at the hospital during part of their recovery, the visitor guidelines for inpatient rooms apply.  PRE-OP VISITATION  Pre-op nurse will coordinate an appropriate time for 1 support person to accompany the patient in pre-op.  This support person may not rotate.  This visitor will be contacted when the time is appropriate for the visitor to come back in the pre-op area.  Please refer to the [facility] website for the visitor guidelines for Inpatients (after your surgery is over and you are in a regular room).  You are not required to quarantine at this time prior to your surgery. However, you must do this: Hand Hygiene often Do NOT share personal items Notify your provider if you are in close contact with someone who has COVID or you develop fever 100.4 or greater, new onset of sneezing, cough, sore throat, shortness of breath or body aches.  If you test positive for Covid or have been in contact with anyone that has tested positive in the last 10 days please notify you surgeon.    Your procedure is scheduled on:  MONDAY  January 12, 2024  Report to [facility] Main Entrance: [address] entrance where the [employer] is available.   Report to admitting at:  09:15   AM  Call this number if you have any questions or problems the morning of surgery [phone]  Do not eat food after Midnight the night prior to your surgery/procedure.  After Midnight you may have the following liquids until  08:45 AM DAY OF SURGERY  Clear Liquid Diet Water Black Coffee (sugar ok, NO MILK/CREAM OR CREAMERS)  Tea (sugar ok, NO MILK/CREAM OR CREAMERS) regular and decaf                             Plain Jell-O  with no fruit (NO RED)                                           Fruit ices  (not with fruit pulp, NO RED)                                     Popsicles (NO RED)                                                                  Juice: NO CITRUS JUICES: only apple, WHITE grape, WHITE cranberry Sports drinks like Gatorade or Powerade (NO RED)                   The day of surgery:  Drink ONE (1) Pre-Surgery Clear Ensure at   0845 AM the morning of surgery. Drink in one sitting. Do not sip.  This drink was given to you during your hospital pre-op appointment visit. Nothing else to drink after  completing the Pre-Surgery Clear Ensure  : No candy, chewing gum or throat lozenges.    FOLLOW ANY ADDITIONAL PRE OP INSTRUCTIONS YOU RECEIVED FROM YOUR SURGEON'S OFFICE!!!   Oral Hygiene is also important to reduce your risk of infection.        Remember - BRUSH YOUR TEETH THE MORNING OF SURGERY WITH YOUR REGULAR TOOTHPASTE  Do NOT smoke after Midnight the night before surgery.  STOP TAKING all Vitamins, Herbs and supplements 1 week before your surgery.   Take ONLY these medicines the morning of surgery with A SIP OF WATER: none   you may use your Eye drops if needed.     You may not have any metal on your body including hair pins, jewelry, and body piercing  Do not wear make-up, lotions, powders, perfumes  or deodorant  Do not wear nail polish including gel and S&S, artificial / acrylic nails, or any other type of covering on natural nails including finger and toenails. If you have artificial nails, gel coating, etc., that needs to be removed by a nail salon, Please have this removed prior to surgery. Not doing so may mean that your surgery could be cancelled or delayed if the Surgeon or anesthesia staff feels like they are unable to monitor you safely.   Do not shave 48 hours prior to surgery to avoid nicks in your skin which may contribute to postoperative infections.   Contacts, Hearing Aids, dentures or bridgework may not be worn into surgery. DENTURES WILL BE REMOVED  PRIOR TO SURGERY PLEASE DO NOT APPLY Poly grip OR ADHESIVES!!!  You may bring a small overnight bag with you on the day of surgery, only pack items that are not valuable. [facility] IS NOT RESPONSIBLE   FOR VALUABLES THAT ARE LOST OR STOLEN.   Do not bring your home medications to the hospital. The Pharmacy will dispense medications listed on your medication list to you during your admission in the Hospital.  Special Instructions: Bring a copy of your healthcare power of attorney and living will documents the day of surgery, if you wish to have them scanned into your [facility] Records- EPIC  Please read over the following fact sheets you were given: IF YOU HAVE QUESTIONS ABOUT YOUR PRE-OP INSTRUCTIONS, PLEASE CALL [phone].     Pre-operative 5 CHG Bath Instructions   You can play a key role in reducing the risk of infection after surgery. Your skin needs to be as free of germs as possible. You can reduce the number of germs on your skin by washing with CHG (chlorhexidine  gluconate) soap before surgery. CHG is an antiseptic soap that kills germs and continues to kill germs even after washing.   DO NOT use if you have an allergy to chlorhexidine /CHG or antibacterial soaps. If your skin becomes reddened or irritated, stop using the CHG and notify one of our RNs at [phone]  Please shower with the CHG soap starting 4 days before surgery using the following schedule: START SHOWERS ON   THURSDAY  January 08, 2024  Please keep in mind the following:  DO NOT shave, including legs and underarms, starting the day of your first shower.   You may shave your face at any point before/day of surgery.   Place clean sheets on your bed the day you start using CHG soap. Use a clean washcloth (not used since being washed)  for each shower. DO NOT sleep with pets once you start using the CHG.   CHG Shower Instructions:  If you choose to wash your hair and private area, wash first with your normal shampoo/soap.  After you use shampoo/soap, rinse your hair and body thoroughly to remove shampoo/soap residue.  Turn the water OFF and apply about 3 tablespoons (45 ml) of CHG soap to a CLEAN washcloth.  Apply CHG soap ONLY FROM YOUR NECK DOWN TO YOUR TOES (washing for 3-5 minutes)  DO NOT use CHG soap on face, private areas, open wounds, or sores.  Pay special attention to the area where your surgery is being performed.  If you are having back surgery, having someone wash your back for you may be helpful.  Wait 2 minutes after CHG soap is applied, then you may rinse off the CHG soap.  Pat dry with a clean towel  Put on clean clothes/pajamas   If you choose to wear lotion, please use ONLY the CHG-compatible lotions on the back of this paper.     Additional instructions for the day of surgery: DO NOT APPLY any lotions, deodorants, cologne, or perfumes.   Put on clean/comfortable clothes.  Brush your teeth.  Ask your nurse before applying any prescription medications to the skin.      CHG Compatible Lotions   Aveeno Moisturizing lotion  Cetaphil Moisturizing Cream  Cetaphil Moisturizing Lotion  Clairol Herbal Essence Moisturizing Lotion, Dry Skin  Clairol Herbal Essence Moisturizing Lotion, Extra Dry Skin  Clairol Herbal Essence Moisturizing Lotion, Normal Skin  Curel Age Defying Therapeutic Moisturizing Lotion with Alpha Hydroxy  Curel Extreme Care Body Lotion  Curel Soothing Hands Moisturizing Hand Lotion  Curel Therapeutic Moisturizing Cream, Fragrance-Free  Curel Therapeutic Moisturizing Lotion, Fragrance-Free  Curel Therapeutic Moisturizing Lotion, Original Formula  Eucerin Daily Replenishing Lotion  Eucerin Dry Skin Therapy Plus Alpha Hydroxy Crme  Eucerin Dry Skin Therapy Plus Alpha Hydroxy  Lotion  Eucerin Original Crme  Eucerin Original Lotion  Eucerin Plus Crme Eucerin Plus Lotion  Eucerin TriLipid Replenishing Lotion  Keri Anti-Bacterial Hand Lotion  Keri Deep Conditioning Original Lotion Dry Skin Formula Softly Scented  Keri Deep Conditioning Original Lotion, Fragrance Free Sensitive Skin Formula  Keri Lotion Fast Absorbing Fragrance Free Sensitive Skin Formula  Keri Lotion Fast Absorbing Softly Scented Dry Skin Formula  Keri Original Lotion  Keri Skin Renewal Lotion Keri Silky Smooth Lotion  Keri Silky Smooth Sensitive Skin Lotion  Nivea Body Creamy Conditioning Oil  Nivea Body Extra Enriched Lotion  Nivea Body Original Lotion  Nivea Body Sheer Moisturizing Lotion Nivea Crme  Nivea Skin Firming Lotion  NutraDerm 30 Skin Lotion  NutraDerm Skin Lotion  NutraDerm Therapeutic Skin Cream  NutraDerm Therapeutic Skin Lotion  ProShield Protective Hand Cream  Provon moisturizing lotion   FAILURE TO FOLLOW THESE INSTRUCTIONS MAY RESULT IN THE CANCELLATION OF YOUR SURGERY  PATIENT SIGNATURE_________________________________  NURSE SIGNATURE__________________________________  ________________________________________________________________________       Katrina Gordon    An incentive spirometer is a tool that can help keep your lungs clear and active. This tool measures how well you are filling your lungs with each breath. Taking  long deep breaths may help reverse or decrease the chance of developing breathing (pulmonary) problems (especially infection) following: A long period of time when you are unable to move or be active. BEFORE THE PROCEDURE  If the spirometer includes an indicator to show your best effort, your nurse or respiratory therapist will set it to a desired goal. If possible, sit up straight or lean slightly forward. Try not to slouch. Hold the incentive spirometer in an upright position. INSTRUCTIONS FOR USE  Sit on the edge of your bed  if possible, or sit up as far as you can in bed or on a chair. Hold the incentive spirometer in an upright position. Breathe out normally. Place the mouthpiece in your mouth and seal your lips tightly around it. Breathe in slowly and as deeply as possible, raising the piston or the ball toward the top of the column. Hold your breath for 3-5 seconds or for as long as possible. Allow the piston or ball to fall to the bottom of the column. Remove the mouthpiece from your mouth and breathe out normally. Rest for a few seconds and repeat Steps 1 through 7 at least 10 times every 1-2 hours when you are awake. Take your time and take a few normal breaths between deep breaths. The spirometer may include an indicator to show your best effort. Use the indicator as a goal to work toward during each repetition. After each set of 10 deep breaths, practice coughing to be sure your lungs are clear. If you have an incision (the cut made at the time of surgery), support your incision when coughing by placing a pillow or rolled up towels firmly against it. Once you are able to get out of bed, walk around indoors and cough well. You may stop using the incentive spirometer when instructed by your caregiver.  RISKS AND COMPLICATIONS Take your time so you do not get dizzy or light-headed. If you are in pain, you may need to take or ask for pain medication before doing incentive spirometry. It is harder to take a deep breath if you are having pain. AFTER USE Rest and breathe slowly and easily. It can be helpful to keep track of a log of your progress. Your caregiver can provide you with a simple table to help with this. If you are using the spirometer at home, follow these instructions: SEEK MEDICAL CARE IF:  You are having difficultly using the spirometer. You have trouble using the spirometer as often as instructed. Your pain medication is not giving enough relief while using the spirometer. You develop fever of  100.5 F (38.1 C) or higher.                                                                                                    SEEK IMMEDIATE MEDICAL CARE IF:  You cough up bloody sputum that had not been present before. You develop fever of 102 F (38.9 C) or greater. You develop worsening pain at or near the incision site. MAKE SURE YOU:  Understand these instructions. Will watch your condition.  Will get help right away if you are not doing well or get worse. Document Released: 09/30/2006 Document Revised: 08/12/2011 Document Reviewed: 12/01/2006 [facility] Patient Information 2014 [place], [place].         WHAT IS A BLOOD TRANSFUSION? Blood Transfusion Information  A transfusion is the replacement of blood or some of its parts. Blood is made up of multiple cells which provide different functions. Red blood cells carry oxygen and are used for blood loss replacement. White blood cells fight against infection. Platelets control bleeding. Plasma helps clot blood. Other blood products are available for specialized needs, such as hemophilia or other clotting disorders. BEFORE THE TRANSFUSION  Who gives blood for transfusions?  Healthy volunteers who are fully evaluated to make sure their blood is safe. This is blood bank blood. Transfusion therapy is the safest it has ever been in the practice of medicine. Before blood is taken from a donor, a complete history is taken to make sure that person has no history of diseases nor engages in risky social behavior (examples are intravenous drug use or sexual activity with multiple partners). The donor's travel history is screened to minimize risk of transmitting infections, such as malaria. The donated blood is tested for signs of infectious diseases, such as HIV and hepatitis. The blood is then tested to be sure it is compatible with you in order to minimize the chance of a transfusion reaction. If you or a relative donates blood, this is often  done in anticipation of surgery and is not appropriate for emergency situations. It takes many days to process the donated blood. RISKS AND COMPLICATIONS Although transfusion therapy is very safe and saves many lives, the main dangers of transfusion include:  Getting an infectious disease. Developing a transfusion reaction. This is an allergic reaction to something in the blood you were given. Every precaution is taken to prevent this. The decision to have a blood transfusion has been considered carefully by your caregiver before blood is given. Blood is not given unless the benefits outweigh the risks. AFTER THE TRANSFUSION Right after receiving a blood transfusion, you will usually feel much better and more energetic. This is especially true if your red blood cells have gotten low (anemic). The transfusion raises the level of the red blood cells which carry oxygen, and this usually causes an energy increase. The nurse administering the transfusion will monitor you carefully for complications. HOME CARE INSTRUCTIONS  No special instructions are needed after a transfusion. You may find your energy is better. Speak with your caregiver about any limitations on activity for underlying diseases you may have. SEEK MEDICAL CARE IF:  Your condition is not improving after your transfusion. You develop redness or irritation at the intravenous (IV) site. SEEK IMMEDIATE MEDICAL CARE IF:  Any of the following symptoms occur over the next 12 hours: Shaking chills. You have a temperature by mouth above 102 F (38.9 C), not controlled by medicine. Chest, back, or muscle pain. People around you feel you are not acting correctly or are confused. Shortness of breath or difficulty breathing. Dizziness and fainting. You get a rash or develop hives. You have a decrease in urine output. Your urine turns a dark color or changes to pink, red, or brown. Any of the following symptoms occur over the next 10  days: You have a temperature by mouth above 102 F (38.9 C), not controlled by medicine. Shortness of breath. Weakness after normal activity. The white part of the eye turns yellow (jaundice).  You have a decrease in the amount of urine or are urinating less often. Your urine turns a dark color or changes to pink, red, or brown. Document Released: 05/17/2000 Document Revised: 08/12/2011 Document Reviewed: 01/04/2008 [facility] Patient Information 2014 [place], [place].  _______________________________________________________________________        If you would like to see a video about joint replacement:   [email or web address]

## 2023-12-31 NOTE — Progress Notes (Signed)
 COVID Vaccine received:  []  No []  Yes Date of any COVID positive Test in last 90 days:  PCP - Heather Close, FNP at [facility]. [facility]-  [phone]  Fax) [phone],    Medical clearance in 12-19-23 Epic note Cardiologist - none  Chest x-ray - 2017  2v  Epic  will repeat per MD orders EKG -  2017  will repeat per MD orders Stress Test -  ECHO -  Cardiac Cath -  CT Coronary Calcium score:   Pacemaker / ICD device [x]  No []  Yes   Spinal Cord Stimulator:[x]  No []  Yes       History of Sleep Apnea? [x]  No []  Yes   CPAP used?- [x]  No []  Yes    Does the patient monitor blood sugar?   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 Patient will start this on:  01-08-2024            Activity level: Able to walk up 2 flights of stairs without becoming significantly short of breath or having chest pain?  []  No   []    Yes  Patient can perform ADLs without assistance. []  No   []   Yes  Anesthesia review: no pertinent medical  history. Dr. Liam has ordered EKG and CXR. Patient had CBC, CMP drawn at PCP 12-19-2023.   Patient denies any S&S of respiratory illness or Covid - no shortness of breath, fever, cough or chest pain at PAT appointment.  Patient verbalized understanding and agreement to the Pre-Surgical Instructions that were given to them at this PAT appointment. Patient was also educated of the need to review these PAT instructions again prior to her surgery.I reviewed the appropriate phone numbers to call if they have any and questions or  concerns.

## 2024-01-01 ENCOUNTER — Encounter (HOSPITAL_COMMUNITY): Payer: Self-pay

## 2024-01-01 ENCOUNTER — Encounter (HOSPITAL_COMMUNITY)
Admission: RE | Admit: 2024-01-01 | Discharge: 2024-01-01 | Disposition: A | Discharge status: Home / Self Care | Source: Ambulatory Visit | Attending: Orthopedic Surgery | Admitting: Orthopedic Surgery

## 2024-01-01 ENCOUNTER — Other Ambulatory Visit: Payer: Self-pay

## 2024-01-01 VITALS — BP 176/73 | HR 64 | Temp 97.9°F | Resp 16 | Ht 66.0 in | Wt 162.0 lb

## 2024-01-01 DIAGNOSIS — Z01818 Encounter for other preprocedural examination: Secondary | ICD-10-CM | POA: Insufficient documentation

## 2024-01-01 LAB — TYPE AND SCREEN
ABO/RH(D): B POS
Antibody Screen: NEGATIVE

## 2024-01-01 LAB — SURGICAL PCR SCREEN
MRSA, PCR: NEGATIVE
Staphylococcus aureus: NEGATIVE

## 2024-01-07 DIAGNOSIS — T84018A Broken internal joint prosthesis, other site, initial encounter: Secondary | ICD-10-CM

## 2024-01-07 DIAGNOSIS — Z96649 Presence of unspecified artificial hip joint: Secondary | ICD-10-CM

## 2024-01-07 NOTE — H&P (Signed)
 TOTAL HIP REVISION ADMISSION H&P  Patient is admitted for right revision total hip arthroplasty.  Subjective:  Chief Complaint: right hip pain  HPI: Katrina Gordon, 73 y.o. female, has a history of pain and functional disability in the right hip due to bearing wear and cyst formation and patient has failed non-surgical conservative treatments for greater than 12 weeks to include NSAID's and/or analgesics, flexibility and strengthening excercises, weight reduction as appropriate, and activity modification. The indications for the revision total hip arthroplasty are bearing surface wear leading to  cyst formation.  Onset of symptoms was gradual starting 1 years ago with gradually worsening course since that time.  Prior procedures on the right hip include arthroplasty.  Patient currently rates pain in the right hip at 10 out of 10 with activity.  There is worsening of pain with activity and weight bearing and pain that interfers with activities of daily living. Patient has evidence of subchondral cysts by imaging studies.  This condition presents safety issues increasing the risk of falls.   There is no current active infection.  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   Primary localized osteoarthritis of left hip 03/11/2016   Primary osteoarthritis of left hip 03/10/2016   Osteopenia 08/15/2015   Endometriosis    Past Medical History:  Diagnosis Date   Arthritis    History of bronchitis 1 yr ago   Joint pain    Joint swelling    Pneumonia 6 wks ago    Past Surgical History:  Procedure Laterality Date   arthroscopic surg Left 1992   ACL repair Ligament replaced   CESAREAN SECTION     x 2    ESOPHAGOGASTRODUODENOSCOPY     KNEE ARTHROPLASTY Left 02/2023   PELVIC LAPAROSCOPY  1985   Diag lap   TOTAL HIP ARTHROPLASTY Right 2003   TOTAL HIP ARTHROPLASTY Left 03/11/2016   Procedure: LEFT TOTAL HIP ARTHROPLASTY ANTERIOR  APPROACH;  Surgeon: Dempsey Sensor, MD;  Location: [facility] OR;  Service: Orthopedics;  Laterality: Left;    No current facility-administered medications for this encounter.   Current Outpatient Medications  Medication Sig Dispense Refill Last Dose/Taking   BIOTIN PO Take 1 tablet by mouth daily.   Taking   carboxymethylcellulose (REFRESH PLUS) 0.5 % SOLN Place 1 drop into both eyes daily as needed (dry/irritated eyes).   Taking As Needed   cyanocobalamin (VITAMIN B12) 500 MCG tablet Take 500 mcg by mouth daily.   Taking   Glucosamine-Chondroitin (MOVE FREE PO) Take 1 tablet by mouth daily.   Taking   meloxicam (MOBIC) 15 MG tablet Take 15 mg by mouth daily as needed for pain.   Taking As Needed   Menthol , Topical Analgesic, (ICY HOT EX) Apply 1 Application topically daily as needed (pain).   Taking As Needed   Multiple Vitamins-Minerals (PRESERVISION/LUTEIN PO) Take 1 capsule by mouth every other day.   Taking   PREMPRO  0.3-1.5 MG tablet Take 1 tablet by mouth daily. 84 tablet 3 Taking   VITAMIN D  PO Take 1 tablet by mouth daily.   Taking   Multiple Vitamins-Minerals (ZINC PO) Take 1 tablet by mouth daily. (Patient not taking: Reported on 12/29/2023)   Not Taking   nystatin -triamcinolone  (MYCOLOG II) cream Apply topically 2 (two) times daily. APPLY TO AFFECTED AREA TWICE A DAY (Patient not taking: Reported on 12/29/2023) 60 g 0 Not Taking   Allergies  Allergen Reactions   Codeine Other (See  Comments)    Doesn't really like    Social History   Tobacco Use   Smoking status: Never   Smokeless tobacco: Never  Substance Use Topics   Alcohol use: Not Currently    Family History  Problem Relation Age of Onset   Hypertension Mother    Heart attack Mother    Diabetes Brother    Dementia Father       Review of Systems  Constitutional: Negative.   HENT: Negative.    Eyes: Negative.   Respiratory: Negative.    Cardiovascular: Negative.   Gastrointestinal: Negative.   Endocrine: Negative.    Genitourinary: Negative.   Musculoskeletal:  Positive for arthralgias and myalgias.  Allergic/Immunologic: Negative.   Neurological: Negative.   Hematological: Negative.   Psychiatric/Behavioral: Negative.      Objective:  Physical Exam Constitutional:      Appearance: Normal appearance. She is normal weight.  HENT:     Head: Normocephalic and atraumatic.     Nose: Nose normal.  Eyes:     Pupils: Pupils are equal, round, and reactive to light.  Cardiovascular:     Pulses: Normal pulses.  Pulmonary:     Effort: Pulmonary effort is normal.  Musculoskeletal:     Cervical back: Normal range of motion and neck supple.     Comments: Today she is walking with a normal gait no irritability to internal or external rotation.    Skin:    General: Skin is warm and dry.  Neurological:     General: No focal deficit present.     Mental Status: She is alert and oriented to person, place, and time. Mental status is at baseline.  Psychiatric:        Mood and Affect: Mood normal.        Behavior: Behavior normal.        Thought Content: Thought content normal.        Judgment: Judgment normal.     Vital signs in last 24 hours:     Labs:   Estimated body mass index is 26.15 kg/m as calculated from the following:   Height as of 01/01/24: 5' 6 (1.676 m).   Weight as of 01/01/24: 73.5 kg.  Imaging Review:  The CT scan images are brought up on the computer and showed to the patient I do believe the acetabular shell is intact the cyst does go from cortices to cortices and the issue him just below the acetabulum.  She has a modular metal-on-metal bearing system with a Pinnacle shell in S-ROM stem.   Assessment/Plan:  End stage arthritis, right hip(s) with failed previous arthroplasty.  The patient history, physical examination, clinical judgement of the provider and imaging studies are consistent with end stage degenerative joint disease of the right hip(s), previous total hip  arthroplasty. Revision total hip arthroplasty is deemed medically necessary. The treatment options including medical management, injection therapy, arthroscopy and arthroplasty were discussed at length. The risks and benefits of total hip arthroplasty were presented and reviewed. The risks due to aseptic loosening, infection, stiffness, dislocation/subluxation,  thromboembolic complications and other imponderables were discussed.  The patient acknowledged the explanation, agreed to proceed with the plan and consent was signed. Patient is being admitted for inpatient treatment for surgery, pain control, PT, OT, prophylactic antibiotics, VTE prophylaxis, progressive ambulation and ADL's and discharge planning. The patient is planning to be discharged home with home health services

## 2024-01-12 ENCOUNTER — Encounter (HOSPITAL_COMMUNITY): Payer: Self-pay | Admitting: Orthopedic Surgery

## 2024-01-12 ENCOUNTER — Inpatient Hospital Stay (HOSPITAL_COMMUNITY)
Admission: RE | Admit: 2024-01-12 | Discharge: 2024-01-13 | DRG: 468 | Disposition: A | Discharge status: Home / Self Care | Attending: Orthopedic Surgery | Admitting: Orthopedic Surgery

## 2024-01-12 ENCOUNTER — Other Ambulatory Visit: Payer: Self-pay

## 2024-01-12 ENCOUNTER — Inpatient Hospital Stay (HOSPITAL_COMMUNITY)

## 2024-01-12 ENCOUNTER — Encounter (HOSPITAL_COMMUNITY)
Admission: RE | Disposition: A | Discharge status: Home / Self Care | Payer: Self-pay | Source: Home / Self Care | Attending: Orthopedic Surgery

## 2024-01-12 ENCOUNTER — Inpatient Hospital Stay (HOSPITAL_COMMUNITY): Admitting: Anesthesiology

## 2024-01-12 ENCOUNTER — Inpatient Hospital Stay (HOSPITAL_COMMUNITY): Payer: Self-pay | Admitting: Medical

## 2024-01-12 DIAGNOSIS — Y792 Prosthetic and other implants, materials and accessory orthopedic devices associated with adverse incidents: Secondary | ICD-10-CM | POA: Diagnosis present

## 2024-01-12 DIAGNOSIS — Z833 Family history of diabetes mellitus: Secondary | ICD-10-CM | POA: Diagnosis not present

## 2024-01-12 DIAGNOSIS — T84010A Broken internal right hip prosthesis, initial encounter: Principal | ICD-10-CM

## 2024-01-12 DIAGNOSIS — T84060A Wear of articular bearing surface of internal prosthetic right hip joint, initial encounter: Secondary | ICD-10-CM

## 2024-01-12 DIAGNOSIS — T84030A Mechanical loosening of internal right hip prosthetic joint, initial encounter: Principal | ICD-10-CM | POA: Diagnosis present

## 2024-01-12 DIAGNOSIS — Z96641 Presence of right artificial hip joint: Secondary | ICD-10-CM | POA: Diagnosis present

## 2024-01-12 DIAGNOSIS — M25551 Pain in right hip: Secondary | ICD-10-CM | POA: Diagnosis present

## 2024-01-12 DIAGNOSIS — Z885 Allergy status to narcotic agent status: Secondary | ICD-10-CM | POA: Diagnosis not present

## 2024-01-12 DIAGNOSIS — Z8249 Family history of ischemic heart disease and other diseases of the circulatory system: Secondary | ICD-10-CM | POA: Diagnosis not present

## 2024-01-12 DIAGNOSIS — Z96649 Presence of unspecified artificial hip joint: Secondary | ICD-10-CM

## 2024-01-12 DIAGNOSIS — Z96652 Presence of left artificial knee joint: Secondary | ICD-10-CM | POA: Diagnosis present

## 2024-01-12 DIAGNOSIS — T84018A Broken internal joint prosthesis, other site, initial encounter: Secondary | ICD-10-CM

## 2024-01-12 DIAGNOSIS — Z7982 Long term (current) use of aspirin: Secondary | ICD-10-CM | POA: Diagnosis not present

## 2024-01-12 DIAGNOSIS — Z96642 Presence of left artificial hip joint: Secondary | ICD-10-CM | POA: Diagnosis present

## 2024-01-12 HISTORY — PX: TOTAL HIP REVISION: SHX763

## 2024-01-12 SURGERY — TOTAL HIP REVISION
Anesthesia: Spinal | Site: Hip | Laterality: Right

## 2024-01-12 MED ORDER — METOCLOPRAMIDE HCL 5 MG/ML IJ SOLN: 5.0000 mg | Freq: Three times a day (TID) | INTRAMUSCULAR | Status: DC | PRN

## 2024-01-12 MED ORDER — DOCUSATE SODIUM 100 MG PO CAPS
100.0000 mg | ORAL_CAPSULE | Freq: Two times a day (BID) | ORAL | Status: DC
  Administered 2024-01-12 – 2024-01-13 (×4): 100 mg via ORAL
  Filled 2024-01-12 (×2): qty 1

## 2024-01-12 MED ORDER — HYDRALAZINE HCL 20 MG/ML IJ SOLN
5.0000 mg | Freq: Once | INTRAMUSCULAR | Status: AC
  Administered 2024-01-12 (×2): 5 mg via INTRAVENOUS

## 2024-01-12 MED ORDER — HYDROMORPHONE HCL 1 MG/ML IJ SOLN
INTRAMUSCULAR | Status: AC
  Filled 2024-01-12: qty 1

## 2024-01-12 MED ORDER — LACTATED RINGERS IV BOLUS
250.0000 mL | Freq: Once | INTRAVENOUS | Status: AC
  Administered 2024-01-12 (×2): 250 mL via INTRAVENOUS

## 2024-01-12 MED ORDER — ACETAMINOPHEN 500 MG PO TABS: 1000.0000 mg | ORAL_TABLET | Freq: Once | ORAL | Status: DC

## 2024-01-12 MED ORDER — FLEET ENEMA RE ENEM: 1.0000 | ENEMA | Freq: Once | RECTAL | Status: DC | PRN

## 2024-01-12 MED ORDER — SODIUM CHLORIDE (PF) 0.9 % IJ SOLN
INTRAMUSCULAR | Status: AC
  Filled 2024-01-12: qty 50

## 2024-01-12 MED ORDER — MIDAZOLAM HCL 2 MG/2ML IJ SOLN
INTRAMUSCULAR | Status: DC | PRN
  Administered 2024-01-12 (×4): 1 mg via INTRAVENOUS

## 2024-01-12 MED ORDER — PHENYLEPHRINE HCL-NACL 20-0.9 MG/250ML-% IV SOLN
INTRAVENOUS | Status: DC | PRN
  Administered 2024-01-12 (×2): 15 ug/min via INTRAVENOUS

## 2024-01-12 MED ORDER — ACETAMINOPHEN 325 MG PO TABS
325.0000 mg | ORAL_TABLET | Freq: Four times a day (QID) | ORAL | Status: DC | PRN
  Administered 2024-01-13 (×2): 650 mg via ORAL
  Filled 2024-01-12: qty 2

## 2024-01-12 MED ORDER — TRAMADOL HCL 50 MG PO TABS
50.0000 mg | ORAL_TABLET | Freq: Four times a day (QID) | ORAL | Status: DC
  Administered 2024-01-12 – 2024-01-13 (×6): 50 mg via ORAL
  Filled 2024-01-12 (×3): qty 1

## 2024-01-12 MED ORDER — LIDOCAINE HCL (PF) 2 % IJ SOLN
INTRAMUSCULAR | Status: DC | PRN
  Administered 2024-01-12 (×2): 60 mg via INTRADERMAL

## 2024-01-12 MED ORDER — LACTATED RINGERS IV SOLN: INTRAVENOUS | Status: DC

## 2024-01-12 MED ORDER — CHLORHEXIDINE GLUCONATE 0.12 % MT SOLN
15.0000 mL | Freq: Once | OROMUCOSAL | Status: AC
  Administered 2024-01-12 (×2): 15 mL via OROMUCOSAL

## 2024-01-12 MED ORDER — DEXAMETHASONE SODIUM PHOSPHATE 10 MG/ML IJ SOLN
10.0000 mg | Freq: Once | INTRAMUSCULAR | Status: AC
  Administered 2024-01-13 (×2): 10 mg via INTRAVENOUS
  Filled 2024-01-12: qty 1

## 2024-01-12 MED ORDER — ORAL CARE MOUTH RINSE: 15.0000 mL | Freq: Once | OROMUCOSAL | Status: AC

## 2024-01-12 MED ORDER — MENTHOL 3 MG MT LOZG: 1.0000 | LOZENGE | OROMUCOSAL | Status: DC | PRN

## 2024-01-12 MED ORDER — EPHEDRINE 5 MG/ML INJ
INTRAVENOUS | Status: AC
  Filled 2024-01-12: qty 5

## 2024-01-12 MED ORDER — BUPIVACAINE IN DEXTROSE 0.75-8.25 % IT SOLN
INTRATHECAL | Status: DC | PRN
  Administered 2024-01-12 (×2): 1.8 mL via INTRATHECAL

## 2024-01-12 MED ORDER — SODIUM CHLORIDE 0.9 % IV SOLN
INTRAVENOUS | Status: DC | PRN
  Administered 2024-01-12 (×2): 90 mL

## 2024-01-12 MED ORDER — ONDANSETRON HCL 4 MG/2ML IJ SOLN
INTRAMUSCULAR | Status: DC | PRN
  Administered 2024-01-12 (×2): 4 mg via INTRAVENOUS

## 2024-01-12 MED ORDER — OXYCODONE HCL 5 MG PO TABS: 5.0000 mg | ORAL_TABLET | ORAL | Status: DC | PRN

## 2024-01-12 MED ORDER — MIDAZOLAM HCL 2 MG/2ML IJ SOLN
INTRAMUSCULAR | Status: AC
  Filled 2024-01-12: qty 2

## 2024-01-12 MED ORDER — TRANEXAMIC ACID-NACL 1000-0.7 MG/100ML-% IV SOLN
1000.0000 mg | INTRAVENOUS | Status: AC
  Administered 2024-01-12 (×2): 1000 mg via INTRAVENOUS
  Filled 2024-01-12: qty 100

## 2024-01-12 MED ORDER — PHENOL 1.4 % MT LIQD: 1.0000 | OROMUCOSAL | Status: DC | PRN

## 2024-01-12 MED ORDER — FENTANYL CITRATE PF 50 MCG/ML IJ SOSY
25.0000 ug | PREFILLED_SYRINGE | INTRAMUSCULAR | Status: DC | PRN
  Administered 2024-01-12 (×2): 25 ug via INTRAVENOUS

## 2024-01-12 MED ORDER — PANTOPRAZOLE SODIUM 40 MG PO TBEC
40.0000 mg | DELAYED_RELEASE_TABLET | Freq: Every day | ORAL | Status: DC
  Administered 2024-01-13 (×2): 40 mg via ORAL
  Filled 2024-01-12: qty 1

## 2024-01-12 MED ORDER — ONDANSETRON HCL 4 MG/2ML IJ SOLN
INTRAMUSCULAR | Status: AC
  Filled 2024-01-12: qty 2

## 2024-01-12 MED ORDER — FENTANYL CITRATE PF 50 MCG/ML IJ SOSY
PREFILLED_SYRINGE | INTRAMUSCULAR | Status: AC
  Filled 2024-01-12: qty 1

## 2024-01-12 MED ORDER — POLYVINYL ALCOHOL 1.4 % OP SOLN: 1.0000 [drp] | Freq: Every day | OPHTHALMIC | Status: DC | PRN

## 2024-01-12 MED ORDER — METHOCARBAMOL 1000 MG/10ML IJ SOLN: 500.0000 mg | Freq: Four times a day (QID) | INTRAMUSCULAR | Status: DC | PRN

## 2024-01-12 MED ORDER — ONDANSETRON HCL 4 MG PO TABS: 4.0000 mg | ORAL_TABLET | Freq: Four times a day (QID) | ORAL | Status: DC | PRN

## 2024-01-12 MED ORDER — ONDANSETRON HCL 4 MG/2ML IJ SOLN: 4.0000 mg | Freq: Four times a day (QID) | INTRAMUSCULAR | Status: DC | PRN

## 2024-01-12 MED ORDER — OXYCODONE-ACETAMINOPHEN 5-325 MG PO TABS: 1.0000 | ORAL_TABLET | ORAL | 0 refills | Status: AC | PRN

## 2024-01-12 MED ORDER — OXYCODONE HCL 5 MG PO TABS
10.0000 mg | ORAL_TABLET | ORAL | Status: DC | PRN
  Administered 2024-01-12 (×2): 15 mg via ORAL
  Administered 2024-01-12 (×2): 10 mg via ORAL
  Filled 2024-01-12 (×3): qty 3

## 2024-01-12 MED ORDER — TRANEXAMIC ACID 1000 MG/10ML IV SOLN
2000.0000 mg | INTRAVENOUS | Status: DC
  Filled 2024-01-12: qty 20

## 2024-01-12 MED ORDER — TIZANIDINE HCL 2 MG PO TABS: 2.0000 mg | ORAL_TABLET | Freq: Four times a day (QID) | ORAL | 0 refills | Status: AC | PRN

## 2024-01-12 MED ORDER — LACTATED RINGERS IV BOLUS
500.0000 mL | Freq: Once | INTRAVENOUS | Status: AC
  Administered 2024-01-12 (×2): 500 mL via INTRAVENOUS

## 2024-01-12 MED ORDER — POVIDONE-IODINE 10 % EX SWAB
2.0000 | Freq: Once | CUTANEOUS | Status: AC
  Administered 2024-01-12 (×2): 2 via TOPICAL

## 2024-01-12 MED ORDER — PROPOFOL 500 MG/50ML IV EMUL
INTRAVENOUS | Status: DC | PRN
Start: 2024-01-12 — End: 2024-01-12
  Administered 2024-01-12 (×2): 35 ug/kg/min via INTRAVENOUS
  Administered 2024-01-12 (×2): 110 ug/kg/min via INTRAVENOUS

## 2024-01-12 MED ORDER — ALUM & MAG HYDROXIDE-SIMETH 200-200-20 MG/5ML PO SUSP: 30.0000 mL | ORAL | Status: DC | PRN

## 2024-01-12 MED ORDER — METHOCARBAMOL 500 MG PO TABS
ORAL_TABLET | ORAL | Status: AC
  Filled 2024-01-12: qty 1

## 2024-01-12 MED ORDER — CEFAZOLIN SODIUM-DEXTROSE 2-4 GM/100ML-% IV SOLN
2.0000 g | INTRAVENOUS | Status: AC
  Administered 2024-01-12 (×2): 2 g via INTRAVENOUS
  Filled 2024-01-12: qty 100

## 2024-01-12 MED ORDER — OXYCODONE HCL 5 MG PO TABS
ORAL_TABLET | ORAL | Status: AC
  Filled 2024-01-12: qty 2

## 2024-01-12 MED ORDER — ACETAMINOPHEN 500 MG PO TABS
1000.0000 mg | ORAL_TABLET | Freq: Four times a day (QID) | ORAL | Status: DC
  Administered 2024-01-12 – 2024-01-13 (×6): 1000 mg via ORAL
  Filled 2024-01-12 (×3): qty 2

## 2024-01-12 MED ORDER — ASPIRIN 81 MG PO CHEW
81.0000 mg | CHEWABLE_TABLET | Freq: Two times a day (BID) | ORAL | Status: DC
  Administered 2024-01-12 – 2024-01-13 (×4): 81 mg via ORAL
  Filled 2024-01-12 (×2): qty 1

## 2024-01-12 MED ORDER — SODIUM CHLORIDE 0.9 % IV SOLN
INTRAVENOUS | Status: DC | PRN
  Administered 2024-01-12 (×2): 2000 mg via TOPICAL

## 2024-01-12 MED ORDER — CEFAZOLIN SODIUM-DEXTROSE 2-4 GM/100ML-% IV SOLN
2.0000 g | Freq: Four times a day (QID) | INTRAVENOUS | Status: AC
  Administered 2024-01-13 (×2): 2 g via INTRAVENOUS
  Filled 2024-01-12: qty 100

## 2024-01-12 MED ORDER — CONJ ESTROG-MEDROXYPROGEST ACE 0.3-1.5 MG PO TABS
1.0000 | ORAL_TABLET | Freq: Every day | ORAL | Status: DC
Start: 2024-01-12 — End: 2024-01-12

## 2024-01-12 MED ORDER — DIPHENHYDRAMINE HCL 12.5 MG/5ML PO ELIX: 12.5000 mg | ORAL_SOLUTION | ORAL | Status: DC | PRN

## 2024-01-12 MED ORDER — BUPIVACAINE LIPOSOME 1.3 % IJ SUSP: 10.0000 mL | Freq: Once | INTRAMUSCULAR | Status: DC

## 2024-01-12 MED ORDER — BUPIVACAINE LIPOSOME 1.3 % IJ SUSP
INTRAMUSCULAR | Status: AC
  Filled 2024-01-12: qty 10

## 2024-01-12 MED ORDER — TRANEXAMIC ACID-NACL 1000-0.7 MG/100ML-% IV SOLN
1000.0000 mg | Freq: Once | INTRAVENOUS | Status: AC
  Administered 2024-01-12 (×2): 1000 mg via INTRAVENOUS
  Filled 2024-01-12: qty 100

## 2024-01-12 MED ORDER — ASPIRIN 81 MG PO TBEC: 81.0000 mg | DELAYED_RELEASE_TABLET | Freq: Two times a day (BID) | ORAL | 0 refills | Status: AC

## 2024-01-12 MED ORDER — METHOCARBAMOL 500 MG PO TABS
500.0000 mg | ORAL_TABLET | Freq: Four times a day (QID) | ORAL | Status: DC | PRN
  Administered 2024-01-12 – 2024-01-13 (×4): 500 mg via ORAL
  Filled 2024-01-12: qty 1

## 2024-01-12 MED ORDER — HYDRALAZINE HCL 20 MG/ML IJ SOLN
INTRAMUSCULAR | Status: AC
  Filled 2024-01-12: qty 1

## 2024-01-12 MED ORDER — FENTANYL CITRATE (PF) 100 MCG/2ML IJ SOLN
INTRAMUSCULAR | Status: DC | PRN
  Administered 2024-01-12 (×2): 50 ug via INTRAVENOUS
  Administered 2024-01-12 (×5): 25 ug via INTRAVENOUS
  Administered 2024-01-12: 50 ug via INTRAVENOUS
  Administered 2024-01-12: 25 ug via INTRAVENOUS
  Administered 2024-01-12: 50 ug via INTRAVENOUS
  Administered 2024-01-12 (×2): 25 ug via INTRAVENOUS

## 2024-01-12 MED ORDER — FENTANYL CITRATE (PF) 100 MCG/2ML IJ SOLN
INTRAMUSCULAR | Status: AC
Start: 2024-01-12 — End: 2024-01-12
  Filled 2024-01-12: qty 2

## 2024-01-12 MED ORDER — PROPOFOL 10 MG/ML IV BOLUS
INTRAVENOUS | Status: DC | PRN
  Administered 2024-01-12: 100 mg via INTRAVENOUS
  Administered 2024-01-12 (×2): 50 mg via INTRAVENOUS
  Administered 2024-01-12: 100 mg via INTRAVENOUS

## 2024-01-12 MED ORDER — POLYETHYLENE GLYCOL 3350 17 G PO PACK: 17.0000 g | PACK | Freq: Every day | ORAL | Status: DC | PRN

## 2024-01-12 MED ORDER — EPHEDRINE SULFATE-NACL 50-0.9 MG/10ML-% IV SOSY
PREFILLED_SYRINGE | INTRAVENOUS | Status: DC | PRN
  Administered 2024-01-12 (×6): 5 mg via INTRAVENOUS

## 2024-01-12 MED ORDER — BISACODYL 5 MG PO TBEC: 5.0000 mg | DELAYED_RELEASE_TABLET | Freq: Every day | ORAL | Status: DC | PRN

## 2024-01-12 MED ORDER — METOCLOPRAMIDE HCL 5 MG PO TABS: 5.0000 mg | ORAL_TABLET | Freq: Three times a day (TID) | ORAL | Status: DC | PRN

## 2024-01-12 MED ORDER — HYDROMORPHONE HCL 1 MG/ML IJ SOLN
0.5000 mg | INTRAMUSCULAR | Status: DC | PRN
  Administered 2024-01-12 (×4): 0.5 mg via INTRAVENOUS
  Administered 2024-01-13 (×4): 1 mg via INTRAVENOUS
  Filled 2024-01-12 (×2): qty 1

## 2024-01-12 MED ORDER — BUPIVACAINE-EPINEPHRINE (PF) 0.25% -1:200000 IJ SOLN
INTRAMUSCULAR | Status: AC
  Filled 2024-01-12: qty 30

## 2024-01-12 MED ORDER — KCL IN DEXTROSE-NACL 20-5-0.45 MEQ/L-%-% IV SOLN
INTRAVENOUS | Status: DC
  Filled 2024-01-12 (×3): qty 1000

## 2024-01-12 MED ORDER — 0.9 % SODIUM CHLORIDE (POUR BTL) OPTIME
TOPICAL | Status: DC | PRN
  Administered 2024-01-12 (×2): 1000 mL

## 2024-01-12 SURGICAL SUPPLY — 41 items
BAG COUNTER SPONGE SURGICOUNT (BAG) IMPLANT
BAG DECANTER FOR FLEXI CONT (MISCELLANEOUS) ×1 IMPLANT
BLADE SAW SGTL 73X25 THK (BLADE) IMPLANT
COVER SURGICAL LIGHT HANDLE (MISCELLANEOUS) ×1 IMPLANT
CUP ACETAB 56MM (Orthopedic Implant) IMPLANT
DRAPE SHEET LG 3/4 BI-LAMINATE (DRAPES) ×1 IMPLANT
DRAPE SURG ORHT 6 SPLT 77X108 (DRAPES) ×2 IMPLANT
DRAPE U-SHAPE 47X51 STRL (DRAPES) ×1 IMPLANT
DRSG AQUACEL AG ADV 3.5X10 (GAUZE/BANDAGES/DRESSINGS) ×1 IMPLANT
DURAPREP 26ML APPLICATOR (WOUND CARE) IMPLANT
ELECT BLADE TIP CTD 4 INCH (ELECTRODE) ×1 IMPLANT
ELECT REM PT RETURN 15FT ADLT (MISCELLANEOUS) ×1 IMPLANT
GLOVE BIO SURGEON STRL SZ7.5 (GLOVE) ×1 IMPLANT
GLOVE BIO SURGEON STRL SZ8.5 (GLOVE) ×1 IMPLANT
GLOVE BIOGEL PI IND STRL 8 (GLOVE) ×1 IMPLANT
GLOVE BIOGEL PI IND STRL 9 (GLOVE) ×1 IMPLANT
GOWN STRL SURGICAL XL XLNG (GOWN DISPOSABLE) ×2 IMPLANT
GRAFT BNE CANC CHIPS 1-8 30CC (Bone Implant) IMPLANT
HEAD FEM SROM 40 +0 MOD (Hips) IMPLANT
HOOD PEEL AWAY T7 (MISCELLANEOUS) ×4 IMPLANT
IMMOBILIZER KNEE 20 THIGH 36 (SOFTGOODS) IMPLANT
KIT TURNOVER KIT A (KITS) ×1 IMPLANT
LINER NEUTRAL HIP ALTRX 40 56 (Liner) IMPLANT
NDL HYPO 21X1.5 SAFETY (NEEDLE) ×1 IMPLANT
NEEDLE HYPO 21X1.5 SAFETY (NEEDLE) ×1 IMPLANT
NS IRRIG 1000ML POUR BTL (IV SOLUTION) ×1 IMPLANT
PACK TOTAL JOINT (CUSTOM PROCEDURE TRAY) ×1 IMPLANT
PASSER SUT SWANSON 36MM LOOP (INSTRUMENTS) IMPLANT
PROTECTOR NERVE ULNAR (MISCELLANEOUS) ×1 IMPLANT
SCREW PINN CAN BONE 6.5MMX15MM (Screw) IMPLANT
SUT ETHIBOND 2 V 37 (SUTURE) IMPLANT
SUT VIC AB 0 CT1 36 (SUTURE) ×2 IMPLANT
SUT VIC AB 1 CTX36XBRD ANBCTR (SUTURE) ×1 IMPLANT
SUT VICRYL+ 3-0 36IN CT-1 (SUTURE) IMPLANT
SWAB COLLECTION DEVICE MRSA (MISCELLANEOUS) IMPLANT
SWAB CULTURE ESWAB REG 1ML (MISCELLANEOUS) IMPLANT
SYR CONTROL 10ML LL (SYRINGE) ×2 IMPLANT
TOWEL OR 17X26 10 PK STRL BLUE (TOWEL DISPOSABLE) ×1 IMPLANT
TRAY CATH INTERMITTENT SS 16FR (CATHETERS) IMPLANT
TUBE SUCTION HIGH CAP CLEAR NV (SUCTIONS) ×1 IMPLANT
WATER STERILE IRR 1000ML POUR (IV SOLUTION) ×2 IMPLANT

## 2024-01-12 NOTE — Transfer of Care (Signed)
 Immediate Anesthesia Transfer of Care Note  Patient: Frank Papadakis  Procedure(s) Performed: TOTAL HIP REVISION (Right: Hip)  Patient Location: PACU  Anesthesia Type:MAC  Level of Consciousness: oriented, drowsy, and patient cooperative  Airway & Oxygen Therapy: Patient Spontanous Breathing and Patient connected to face mask oxygen  Post-op Assessment: Report given to RN and Post -op Vital signs reviewed and stable  Post vital signs: Reviewed and stable  Last Vitals:  Vitals Value Taken Time  BP 150/74 01/12/24 15:23  Temp    Pulse 78 01/12/24 15:25  Resp 15 01/12/24 15:25  SpO2 98 % 01/12/24 15:25  Vitals shown include unfiled device data.  Last Pain:  Vitals:   01/12/24 0855  TempSrc:   PainSc: 4       Patients Stated Pain Goal: 4 (01/12/24 0855)  Complications: No notable events documented.

## 2024-01-12 NOTE — Interval H&P Note (Signed)
 History and Physical Interval Note:  01/12/2024 8:58 AM  Katrina Gordon  has presented today for surgery, with the diagnosis of RIGHT TOTAL HIP ARTHROPLASTY WORN BEARING, OSTEOLYSIS.  The various methods of treatment have been discussed with the patient and family. After consideration of risks, benefits and other options for treatment, the patient has consented to  Procedure(s) with comments: TOTAL HIP REVISION (Right) - REQUEST REVISION OF RIGHT TOTAL HIP ARTHROPLASTY LINER/FEMORAL HEAD. SROM/ PINNACLE IMPLANTS as a surgical intervention.  The patient's history has been reviewed, patient examined, no change in status, stable for surgery.  I have reviewed the patient's chart and labs.  Questions were answered to the patient's satisfaction.     Dempsey JINNY Sensor

## 2024-01-12 NOTE — Discharge Instructions (Addendum)

## 2024-01-12 NOTE — OR Nursing (Signed)
 Components explanted from right hip by Carry Clapper, MD

## 2024-01-12 NOTE — Anesthesia Procedure Notes (Signed)
 Spinal  Patient location during procedure: OR Start time: 01/12/2024 12:52 PM End time: 01/12/2024 12:57 PM Reason for block: surgical anesthesia Staffing Performed: anesthesiologist  Anesthesiologist: Epifanio Fallow, MD Performed by: Epifanio Fallow, MD Authorized by: Epifanio Fallow, MD   Preanesthetic Checklist Completed: patient identified, IV checked, risks and benefits discussed, surgical consent, monitors and equipment checked, pre-op evaluation and timeout performed Spinal Block Patient position: sitting Prep: DuraPrep Patient monitoring: cardiac monitor, continuous pulse ox and blood pressure Approach: midline Location: L3-4 Injection technique: single-shot Needle Needle type: Pencan  Needle gauge: 24 G Needle length: 9 cm Assessment Sensory level: T8 Events: CSF return Additional Notes Functioning IV was confirmed and monitors were applied. Sterile prep and drape, including hand hygiene and sterile gloves were used. The patient was positioned and the spine was prepped. The skin was anesthetized with lidocaine .  Free flow of clear CSF was obtained prior to injecting local anesthetic into the CSF.  The spinal needle aspirated freely following injection.  The needle was carefully withdrawn.  The patient tolerated the procedure well.

## 2024-01-12 NOTE — Op Note (Signed)
 PATIENT ID:      Katrina Gordon  MRN:     [id number] DOB/AGE:    75-Oct-1952 / 75 y.o.       OPERATIVE REPORT    DATE OF PROCEDURE:  01/12/2024       PREOPERATIVE DIAGNOSIS:  RIGHT TOTAL HIP ARTHROPLASTY WORN BEARING, OSTEOLYSIS                                                       Estimated body mass index is 26.15 kg/m as calculated from the following:   Height as of this encounter: 5' 6 (1.676 m).   Weight as of this encounter: 73.5 kg.     POSTOPERATIVE DIAGNOSIS: Right total hip arthroplasty with Butler bearing, osteolysis, and loosening of the acetabular component.  Large cyst going into the ischium.                                                          PROCEDURE: Revision right total hip arthroplasty with removal of loose acetabular component, placement of a 56 mm DePuy GRIPTION multihole cup with 2 dome screws, +4 polyethylene liner.  We also curetted a large ischial cyst and packed it with crushed cancellous allograft bone.  We placed a new +0 x 40 mm metal head.    SURGEON: Dempsey JINNY Sensor    ASSISTANT:   Camellia POUR. [employer]  (present throughout entire procedure and necessary for timely completion of the procedure)  ANESTHESIA: Spinal BLOOD LOSS: 400 cc FLUID REPLACEMENT: 1500  cc of crystalloid     Tranexamic acid : 1gm IV, 2gm Topical Exparel : 10cc DRAINS: None COMPLICATIONS: None    INDICATIONS FOR PROCEDURE: 73 year old female who had a primary right total hip arthroplasty by me in the year 2003.  We used a DePuy 52 mm Pinnacle shell, ultimate bearing and liner, S-ROM stem with a +0 x 36 mm head.  For the last year she is complaining of some pain when she sits down along the ischium plain radiographs did show a cyst in the ischium coming off the inferior aspect of the acetabulum.  CT scan confirmed this.  Also carefully looking at the x-rays the cup may have migrated a few millimeters superiorly.  No fevers or chills or wound problems.  Patient plays pickle ball and is very  active.  To decrease pain and increase function she desires elective revision of the acetabular liner and head possibly the acetabular component and the stem as needed.  The risk and benefits of surgery been discussed and all questions answered.  Will also packed the cyst in the ischium with bone graft.  PROCEDURE IN DETAIL: The patient was identified by armband,  received preoperative IV antibiotics in the holding area at [facility], taken to the operating room , appropriate anesthetic monitors  were attached and general endotracheal anesthesia induced. Foley catheter was inserted. Patient was rolled into the left lateral decubitus position and fixed there with a Stulberg Mark II pelvic clamp and the right lower extremity was then prepped and draped  in the usual sterile fashion from the ankle to the hemipelvis. A time-out  procedure  was performed. Camellia POUR. [facility] was present and scrubbed throughout the case, critical for assistance with, positioning, exposure, retraction, instrumentation, and closure.The skin along the lateral hip and thigh  infiltrated with 10 mL of 0.5% Marcaine  and epinephrine  solution. We  then made a posterolateral approach to the hip. With a #10 blade, 13 cm  incision through skin and subcutaneous tissue down to the level of the  IT band. Small bleeders were identified and cauterized. IT band cut in  line with skin incision exposing the greater trochanter. A Cobra retractor was placed between the gluteus minimus and the superior hip joint capsule, and a spiked Cobra between the quadratus femoris and the inferior hip joint capsule. The posterior  Pseudocapsule capsule was then developed into an acetabular-based flap from Posterior Superior off of the acetabulum out over the femoral neck and back posterior inferior to the acetabular rim. This flap was tagged with two #2 Ethibond sutures and retracted protecting the sciatic nerve. This exposed neck of the S-ROM stem  and ultimet head.  The hip was flexed and internally rotated dislocating the head from the socket.  Using a metal rod and a mallet the head was removed from the neck and the neck was noted to be in good condition.  The neck of the S-ROM stem was then tucked superior and anterior to the acetabulum and held there with a Hohmann retractor.  We then remove scar tissue from around the edges of the Pinnacle shell and once this was accomplished remove the ultimet liner.  The apex eliminator was removed the driving rod was screwed into the shell which was then noted to be loose.  More scar tissue was removed allowing us  to remove the shell from the acetabulum the only bony defect was the cyst going into the ischium that was about 1 cm x 1 cm in diameter.  We then sequentially reamed up to 55 mm obtaining good coverage in all quadrants.  At this point the cyst was cleaned with curettes and rongeurs.  An crushed cancellous bone graft was placed into the cyst and reverse reamed into the acetabulum.  We selected a 56 mm DePuy GRIPTION multihole shell which was then hammered into place and 45 degrees of abduction and 20 degrees of anteversion without any difficulty.  Good grip was obtained.  We placed 2 supplemental dome screws superior and superior anterior.  We then performed a trial reduction with a +0 liner and a +3 x 36 mm femoral head.  Stability was noted to full extension with 45 external rotation and 100 degrees of flexion with 60 of internal rotation.  The trial components removed the wound was thoroughly irrigated out with normal saline solution and Exparel  was injected in the soft tissues.  A +4 neutral polyethylene liner was then hammered into the Gryption shell a +0 x 40 mm head was then hammered onto the S-ROM stem and the hip again reduced and stability found to be excellent.  We again irrigated.  The IT band was closing with a running #1 Vicryl suture in the subcutaneous tissue with running 3-0 Vicryl suture as  well as the skin with 3-0 subcuticular running suture.  An Aquacel dressing was applied.  The patient was unclamp rolled supine awakened and taken to the recovery room without difficulty.      The IT band was closed with running 1 Vicryl suture. The subcutaneous  tissue with 0 and 2-0 undyed Vicryl suture and the skin with running  3-0 Vivryl SQ suture. Aquacil dessing was applied. The patient was then unclamped, rolled supine, awaken extubated and taken to recovery room without difficulty in stable condition.   Dempsey JINNY Sensor 01/12/2024, 8:59 AM

## 2024-01-12 NOTE — Evaluation (Signed)
 Physical Therapy Evaluation Patient Details Name: Katrina Gordon MRN: [id number] DOB: Jul 26, 1950 Today's Date: 01/12/2024  History of Present Illness  73 yo female presents to therapy s/p R THA revision of liner and femoral head secondary to osteolysis, loosening of the acetabular component and large cyst going into the ischium as well as failure of conservative measures. Pt is currently R LE WBAT with posterior hip precautions. Pt PMH includes but is not limited to: OA, and B THA.  Clinical Impression      Katrina Gordon is a 73 y.o. female POD 0 s/p R THA revision. Patient reports IND with mobility at baseline. Patient is now limited by functional impairments (see PT problem list below) and requires S for bed mobility and CGA for transfers. Pt unable to ambulate due to symptomatic orthostatic hypotension. Patient instructed in exercise to facilitate ROM and circulation to manage edema initiated in supine. Pt and family ed provided on posterior hip precautions. Patient will benefit from continued skilled PT interventions to address impairments and progress towards PLOF. Acute PT will follow to progress mobility and stair training in preparation for safe discharge home with family support and [facility] services. PT communicated with nurse at beginning of eval per elevated BP at rest 190/98 and nurse able to call MD to make aware and PT cleared to proceeded with Evaluation with pt very motivated for same day d/c Bp at rest semi reclined 176/86 Bp seated EOB 156/124 Bp unable to obtain in standing due to reports of pt going to pass out with brief LOC episode, PT and nurse returned pt to supine and elevated B LE on stretcher and Bp 125/72 Bp s/p 5 min supine 174/81 Pt and family ed provided on MD, nursing and PT recommending pt remain in hospital for observation. Pt and family made aware PT to provide intervention 8/12 and continue to assess pt for safe transition home.      If plan is discharge home,  recommend the following: A little help with walking and/or transfers;A little help with bathing/dressing/bathroom;Assistance with cooking/housework;Assist for transportation;Help with stairs or ramp for entrance   Can travel by private vehicle        Equipment Recommendations None recommended by PT  Recommendations for Other Services       Functional Status Assessment Patient has had a recent decline in their functional status and demonstrates the ability to make significant improvements in function in a reasonable and predictable amount of time.     Precautions / Restrictions Precautions Precautions: Fall;Posterior Hip Restrictions Weight Bearing Restrictions Per Provider Order: No      Mobility  Bed Mobility Overal bed mobility: Needs Assistance Bed Mobility: Supine to Sit     Supine to sit: Supervision, HOB elevated     General bed mobility comments: min cues    Transfers Overall transfer level: Needs assistance Equipment used: Rolling walker (2 wheels) Transfers: Sit to/from Stand Sit to Stand: Contact guard assist           General transfer comment: min cues    Ambulation/Gait               General Gait Details: NT due to symptomatic orthostatic hypotension  Stairs            Wheelchair Mobility     Tilt Bed    Modified Rankin (Stroke Patients Only)       Balance Overall balance assessment: Needs assistance, History of Falls (1) Sitting-balance support: Feet supported Sitting balance-Leahy Scale: Good  Standing balance support: Bilateral upper extremity supported, During functional activity, Reliant on assistive device for balance Standing balance-Leahy Scale: Poor                               Pertinent Vitals/Pain Pain Assessment Pain Assessment: 0-10 Pain Score: 7  Pain Location: R hip and LE Pain Descriptors / Indicators: Aching, Constant, Discomfort, Dull, Grimacing, Operative site guarding,  Restless Pain Intervention(s): Limited activity within patient's tolerance, Monitored during session, Premedicated before session, Repositioned, Ice applied    Home Living Family/patient expects to be discharged to:: Private residence Living Arrangements: Alone Available Help at Discharge: Family Type of Home: House Home Access: Stairs to enter Entrance Stairs-Rails: Right;Left;Can reach both Entrance Stairs-Number of Steps: 3   Home Layout: One level Home Equipment: Agricultural consultant (2 wheels);Cane - single point;Shower seat      Prior Function Prior Level of Function : Independent/Modified Independent;Driving             Mobility Comments: IND no AD for all ADLs, self care tasks and IADLs ADLs Comments: very active at baseline     Extremity/Trunk Assessment        Lower Extremity Assessment Lower Extremity Assessment: RLE deficits/detail RLE Deficits / Details: ankle DF/PF 5/5 RLE Sensation: WNL    Cervical / Trunk Assessment Cervical / Trunk Assessment: Normal  Communication   Communication Communication: No apparent difficulties    Cognition Arousal: Alert Behavior During Therapy: WFL for tasks assessed/performed   PT - Cognitive impairments: No apparent impairments                         Following commands: Intact       Cueing       General Comments      Exercises Total Joint Exercises Ankle Circles/Pumps: AROM, Both, 10 reps Quad Sets: AROM, Right, 5 reps Heel Slides: AROM, Right, 5 reps   Assessment/Plan    PT Assessment Patient needs continued PT services  PT Problem List Decreased strength;Decreased range of motion;Decreased activity tolerance;Decreased balance;Decreased mobility;Decreased coordination;Pain       PT Treatment Interventions DME instruction;Gait training;Stair training;Therapeutic activities;Functional mobility training;Therapeutic exercise;Balance training;Neuromuscular re-education;Patient/family  education;Modalities    PT Goals (Current goals can be found in the Care Plan section)  Acute Rehab PT Goals Patient Stated Goal: return to playing pickleball in 6 wks PT Goal Formulation: With patient Time For Goal Achievement: 01/26/24 Potential to Achieve Goals: Good    Frequency 7X/week     Co-evaluation               AM-PAC PT 6 Clicks Mobility  Outcome Measure Help needed turning from your back to your side while in a flat bed without using bedrails?: None Help needed moving from lying on your back to sitting on the side of a flat bed without using bedrails?: A Little Help needed moving to and from a bed to a chair (including a wheelchair)?: A Little Help needed standing up from a chair using your arms (e.g., wheelchair or bedside chair)?: A Little Help needed to walk in hospital room?: Total Help needed climbing 3-5 steps with a railing? : Total 6 Click Score: 15    End of Session Equipment Utilized During Treatment: Gait belt Activity Tolerance: Treatment limited secondary to medical complications (Comment) (symptomatic orthostatic hypotension) Patient left: in bed;with call bell/phone within reach;with nursing/sitter in room;with family/visitor present Nurse Communication:  Mobility status PT Visit Diagnosis: Unsteadiness on feet (R26.81);Other abnormalities of gait and mobility (R26.89);Muscle weakness (generalized) (M62.81);History of falling (Z91.81);Difficulty in walking, not elsewhere classified (R26.2);Pain Pain - Right/Left: Right Pain - part of body: Hip;Leg    Time: 8353-8274 PT Time Calculation (min) (ACUTE ONLY): 39 min   Charges:   PT Evaluation $PT Eval Low Complexity: 1 Low PT Treatments $Therapeutic Exercise: 8-22 mins $Therapeutic Activity: 8-22 mins PT General Charges $$ ACUTE PT VISIT: 1 Visit         [place], PT Acute Rehab   [place] VEAR Drone 01/12/2024, 6:05 PM

## 2024-01-12 NOTE — Anesthesia Preprocedure Evaluation (Addendum)
 Anesthesia Evaluation  Patient identified by MRN, date of birth, ID band Patient awake    Reviewed: Allergy & Precautions, H&P , NPO status , Patient's Chart, lab work & pertinent test results  Airway Mallampati: II  TM Distance: >3 FB Neck ROM: Full    Dental no notable dental hx. (+) Teeth Intact, Dental Advisory Given   Pulmonary neg pulmonary ROS   Pulmonary exam normal breath sounds clear to auscultation       Cardiovascular negative cardio ROS  Rhythm:Regular Rate:Normal     Neuro/Psych negative neurological ROS  negative psych ROS   GI/Hepatic negative GI ROS, Neg liver ROS,,,  Endo/Other  negative endocrine ROS    Renal/GU negative Renal ROS  negative genitourinary   Musculoskeletal  (+) Arthritis , Osteoarthritis,    Abdominal   Peds  Hematology negative hematology ROS (+)   Anesthesia Other Findings   Reproductive/Obstetrics negative OB ROS                              Anesthesia Physical Anesthesia Plan  ASA: 2  Anesthesia Plan: Spinal   Post-op Pain Management: Tylenol  PO (pre-op)*   Induction: Intravenous  PONV Risk Score and Plan: 3 and Ondansetron , Dexamethasone  and Propofol  infusion  Airway Management Planned: Natural Airway and Simple Face Mask  Additional Equipment:   Intra-op Plan:   Post-operative Plan:   Informed Consent: I have reviewed the patients History and Physical, chart, labs and discussed the procedure including the risks, benefits and alternatives for the proposed anesthesia with the patient or authorized representative who has indicated his/her understanding and acceptance.     Dental advisory given  Plan Discussed with: CRNA  Anesthesia Plan Comments:          Anesthesia Quick Evaluation

## 2024-01-12 NOTE — Anesthesia Postprocedure Evaluation (Signed)
 Anesthesia Post Note  Patient: Demmi Somes  Procedure(s) Performed: TOTAL HIP REVISION (Right: Hip)     Patient location during evaluation: PACU Anesthesia Type: Spinal Level of consciousness: oriented and awake and alert Pain management: pain level controlled Vital Signs Assessment: post-procedure vital signs reviewed and stable Respiratory status: spontaneous breathing and respiratory function stable Cardiovascular status: blood pressure returned to baseline and stable Postop Assessment: no headache, no backache, no apparent nausea or vomiting, spinal receding and patient able to bend at knees Anesthetic complications: no   No notable events documented.  Last Vitals:  Vitals:   01/12/24 1545 01/12/24 1558  BP: (!) 143/85   Pulse: 66 65  Resp: 17 19  Temp:    SpO2: 92% 95%    Last Pain:  Vitals:   01/12/24 1558  TempSrc:   PainSc: 8                  Elanora Quin,W. EDMOND

## 2024-01-12 NOTE — Plan of Care (Signed)

## 2024-01-13 ENCOUNTER — Encounter (HOSPITAL_COMMUNITY): Payer: Self-pay | Admitting: Orthopedic Surgery

## 2024-01-13 NOTE — TOC Transition Note (Signed)
 Transition of Care [facility]) - Discharge Note   Patient Details  Name: Katrina Gordon MRN: [id number] Date of Birth: November 23, 1950  [facility]) CM/SW Contact:  Alfonse JONELLE Rex, RN Phone Number: 01/13/2024, 10:39 AM   Clinical Narrative:   Met with patient at bedside to review dc therapy and home equipment needs, H&P notes HH PT at discharge, patient reports she has an OPPT appt scheduled, reports she has a RW, no equipment needs. No TOC needs    Final next level of care: OP Rehab Barriers to Discharge: No Barriers Identified   Patient Goals and CMS Choice Patient states their goals for this hospitalization and ongoing recovery are:: return home          Discharge Placement                       Discharge Plan and Services Additional resources added to the After Visit Summary for                                       Social Drivers of Health (SDOH) Interventions SDOH Screenings   Food Insecurity: No Food Insecurity (01/12/2024)  Housing: Low Risk  (01/12/2024)  Transportation Needs: No Transportation Needs (01/12/2024)  Utilities: Not At Risk (01/12/2024)  Social Connections: Moderately Integrated (01/12/2024)  Tobacco Use: Low Risk  (01/12/2024)     Readmission Risk Interventions    01/13/2024   10:36 AM  Readmission Risk Prevention Plan  Post Dischage Appt Complete  Medication Screening Complete  Transportation Screening Complete

## 2024-01-13 NOTE — Progress Notes (Signed)
 Patient ID: Katrina Gordon, female   DOB: Jan 14, 1951, 73 y.o.   MRN: [id number] PATIENT ID: Katrina Gordon  MRN: [id number]  DOB/AGE:  Feb 03, 1951 / 73 y.o.  1 Day Post-Op Procedure(s) (LRB): TOTAL HIP REVISION (Right)    PROGRESS NOTE Subjective: Patient is alert, oriented, no Nausea, no Vomiting, yes passing gas, . Taking PO well. Denies SOB, Chest or Calf Pain. Using Incentive Spirometer, PAS in place. Ambulate WBAT, Yesterday's total administered Morphine Milligram Equivalents: 130), Patient reports pain as  3/10. Patient got up to walk with physical therapy yesterday and got orthostatic.  Because of this we elected to keep her for overnight obvious.  Through the night she was able to get up and use the bedside commode and had no further dizziness.  Her urine output has been excellent and she has been able to eat food without any difficulties.  Objective: Vital signs in last 24 hours: Vitals:   01/13/24 0006 01/13/24 0008 01/13/24 0213 01/13/24 0611  BP:  (!) 174/92 (!) 145/69 (!) 142/70  Pulse: 100 99 78 76  Resp:  18 16 18   Temp:  98.5 F (36.9 C)  98.9 F (37.2 C)  TempSrc:  Oral  Oral  SpO2: 91% 95% 97% 100%  Weight:      Height:          Intake/Output from previous day: I/O last 3 completed shifts: In: 4415.4 [P.O.:720; I.V.:3395.4; IV Piggyback:300] Out: 1350 [Urine:1100; Blood:250]   Intake/Output this shift: No intake/output data recorded.   LABORATORY DATA: No results for input(s): WBC, HGB, HCT, PLT, NA, K, CL, CO2, BUN, CREATININE, GLUCOSE, GLUCAP, INR, CALCIUM in the last 72 hours.  Invalid input(s): PT, 2  Examination: Neurologically intact ABD soft Neurovascular intact Sensation intact distally Intact pulses distally Dorsiflexion/Plantar flexion intact Incision: scant drainage No cellulitis present Compartment soft} XR AP&Lat of hip shows well placed\fixed Patient got up to walk with physical therapy yesterday and got  orthostatic.  Because of this we elected to keep her for overnight obvious.  Through the night she was able to get up and use the bedside commode and had no further dizziness.  Her urine output has been excellent and she has been able to eat food without any difficulties.  Assessment:   1 Day Post-Op Procedure(s) (LRB): TOTAL HIP REVISION (Right) ADDITIONAL DIAGNOSIS: HTN  Patient's anticipated LOS is less than 2 midnights, meeting these requirements: - Younger than 81 - Lives within 1 hour of care - Has a competent adult at home to recover with post-op recover - NO history of  - Chronic pain requiring opiods  - Diabetes  - Coronary Artery Disease  - Heart failure  - Heart attack  - Stroke  - DVT/VTE  - Cardiac arrhythmia  - Respiratory Failure/COPD  - Renal failure  - Anemia  - Advanced Liver disease     Plan: PT/OT WBAT, THA, Ost preautions  DVT Prophylaxis: SCDx72 hrs, ASA 81 mg BID x 2 weeks  DISCHARGE PLAN: Home, today if passes PT  DISCHARGE NEEDS: Walker and 3-in-1 comode seat

## 2024-01-13 NOTE — Progress Notes (Signed)
 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 Pt is currently R LE WBAT with posterior hip precautions. Pt PMH includes but is not limited to: OA, and B THA.    PT Comments   Katrina Gordon is a 73 y.o. female POD 1 s/p R THA revision. Patient reports IND with mobility at baseline. Patient is now limited by functional impairments (see PT problem list below) and requires S and cues for transfers and gait with RW. Patient was able to ambulate 130 feet with RW and CGA and progressing to close S and cues for safe walker management. Patient educated on safe sequencing for stair mobility pt and daughter verbalized understanding of safe guarding position for people assisting with mobility. Patient instructed in exercises to facilitate ROM and circulation reviewed and HO provided. Pt and family ed on posterior hip precautions and HO provided. Patient will benefit from continued skilled PT interventions to address impairments and progress towards PLOF. Patient has met mobility goals at adequate level for discharge home with family support and OPPT services; will continue to follow if pt continues acute stay to progress towards Mod I goals.    If plan is discharge home, recommend the following: A little help with walking and/or transfers;A little help with bathing/dressing/bathroom;Assistance with cooking/housework;Assist for transportation;Help with stairs or ramp for entrance   Can travel by private vehicle        Equipment Recommendations  None recommended by PT    Recommendations for Other Services       Precautions / Restrictions Precautions Precautions: Fall;Posterior  Hip Restrictions Weight Bearing Restrictions Per Provider Order: No     Mobility  Bed Mobility Overal bed mobility: Needs Assistance Bed Mobility: Supine to Sit     Supine to sit: HOB elevated, Min assist     General bed mobility comments: pt seated EOB when PT arrived    Transfers Overall transfer level: Needs assistance Equipment used: Rolling walker (2 wheels) Transfers: Sit to/from Stand Sit to Stand: Supervision           General transfer comment: min cues for UE and AD placement for bed, recliner and commode transefrs    Ambulation/Gait Ambulation/Gait assistance: Contact guard assist, Supervision Gait Distance (Feet): 130 Feet Assistive device: Rolling walker (2 wheels) Gait Pattern/deviations: Step-to pattern, Antalgic, Trunk flexed, Decreased stance time - right       General Gait Details: decreased   Stairs             Wheelchair Mobility     Tilt Bed    Modified Rankin (Stroke Patients Only)       Balance Overall balance assessment: Needs assistance, History of Falls (1) Sitting-balance support: Feet supported Sitting balance-Leahy Scale: Good     Standing balance support: Bilateral upper extremity supported, During functional activity, Reliant on assistive device for balance Standing balance-Leahy Scale: Fair Standing balance comment: static standing no UE support                            Communication Communication Communication: No apparent difficulties  Cognition Arousal: Alert Behavior During Therapy: WFL for tasks assessed/performed   PT - Cognitive impairments: No  apparent impairments                         Following commands: Intact      Cueing    Exercises Total Joint Exercises Ankle Circles/Pumps: AROM, Both, 10 reps Hip ABduction/ADduction: AROM, Right, 5 reps, Standing Long Arc Quad: AROM, Right, 5 reps, Seated Knee Flexion: AROM, Right, 5 reps, Standing Standing Hip Extension:  AROM, Right, 5 reps, Standing    General Comments        Pertinent Vitals/Pain Pain Assessment Pain Assessment: 0-10 Pain Score: 4  Pain Location: R hip and LE Pain Descriptors / Indicators: Aching, Constant, Discomfort, Dull, Grimacing, Operative site guarding, Restless Pain Intervention(s): Limited activity within patient's tolerance, Monitored during session, Premedicated before session, Repositioned, Ice applied    Home Living                          Prior Function            PT Goals (current goals can now be found in the care plan section) Acute Rehab PT Goals Patient Stated Goal: return to playing pickleball in 6 wks PT Goal Formulation: With patient Time For Goal Achievement: 01/26/24 Potential to Achieve Goals: Good Progress towards PT goals: Progressing toward goals    Frequency    7X/week      PT Plan      Co-evaluation              AM-PAC PT 6 Clicks Mobility   Outcome Measure  Help needed turning from your back to your side while in a flat bed without using bedrails?: None Help needed moving from lying on your back to sitting on the side of a flat bed without using bedrails?: A Little Help needed moving to and from a bed to a chair (including a wheelchair)?: A Little Help needed standing up from a chair using your arms (e.g., wheelchair or bedside chair)?: A Little Help needed to walk in hospital room?: A Little Help needed climbing 3-5 steps with a railing? : A Little 6 Click Score: 19    End of Session Equipment Utilized During Treatment: Gait belt Activity Tolerance: Patient tolerated treatment well;No increased pain Patient left: with call bell/phone within reach;in chair;with family/visitor present Nurse Communication: Mobility status;Other (comment) (pt readiness for d/c from PT [place]) PT Visit Diagnosis: Unsteadiness on feet (R26.81);Other abnormalities of gait and mobility (R26.89);Muscle weakness (generalized)  (M62.81);History of falling (Z91.81);Difficulty in walking, not elsewhere classified (R26.2);Pain Pain - Right/Left: Right Pain - part of body: Hip;Leg     Time: 8768-8745 PT Time Calculation (min) (ACUTE ONLY): 23 min  Charges:    $Gait Training: 8-22 mins $Therapeutic Activity: 8-22 mins PT General Charges $$ ACUTE PT VISIT: 1 Visit                     [place], PT Acute Rehab    [place] VEAR Drone 01/13/2024, 1:55 PM

## 2024-01-13 NOTE — Discharge Summary (Signed)
 Patient ID: Katrina Gordon MRN: [id number] DOB/AGE: 08-Mar-1951 73 y.o.  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 female who was admitted 01/12/2024 for operative treatment ofFailed total hip arthroplasty (HCC). Patient has severe unremitting pain that affects sleep, daily activities, and work/hobbies. After pre-op clearance the patient was taken to the operating room on 01/12/2024 and underwent  Procedure(s): TOTAL HIP REVISION.    Patient was given perioperative antibiotics:  Anti-infectives (From admission, onward)    Start     Dose/Rate Route Frequency Ordered Stop   01/12/24 1900  ceFAZolin  (ANCEF ) IVPB 2g/100 mL premix        2 g 200 mL/hr over 30 Minutes Intravenous Every 6 hours 01/12/24 1944 01/13/24 0659   01/12/24 0845  ceFAZolin  (ANCEF ) IVPB 2g/100 mL premix        2 g 200 mL/hr over 30 Minutes Intravenous On call to O.R. 01/12/24 0831 01/12/24 1258        Patient was given sequential compression devices, early ambulation, and chemoprophylaxis to prevent DVT.  Inpatient Morphine Milligram Equivalents Per Day 8/11 - 8/12   Values displayed are in units of MME/Day    Order Start / End Date Yesterday Today    fentaNYL  (SUBLIMAZE ) injection 25-50 mcg 8/11 - 8/11 7.5 of 45-90 --    fentaNYL  (SUBLIMAZE ) injection 8/11 - 8/11 *60 of 60 --    HYDROmorphone  (DILAUDID ) injection 0.5-1 mg 8/11 - No end date 20 of 20-40 40 of 60-120    traMADol  (ULTRAM ) tablet 50 mg 8/11 - No end date 5 of 5 10 of 20     oxyCODONE  (Oxy IR/ROXICODONE ) immediate release tablet 5-10 mg 8/11 - No end date 0 of 15-30 0 of 45-90    oxyCODONE  (Oxy IR/ROXICODONE ) immediate release tablet 10-15 mg 8/11 - No end date 37.5 of 30-45 0 of 90-135    Daily Totals  * 130 of 175-270 50 of 215-365  *One-Step medication     Patient benefited maximally from hospital stay and there were no complications.    Recent vital signs: Patient Vitals for the past 24 hrs:  BP Temp Temp src Pulse Resp SpO2  01/13/24 0934 (!) 154/66 98.4 F (36.9 C) Oral 76 16 96 %  01/13/24 0611 (!) 142/70 98.9 F (37.2 C) Oral 76 18 100 %  01/13/24 0213 (!) 145/69 -- -- 78 16 97 %  01/13/24 0008 (!) 174/92 98.5 F (36.9 C) Oral 99 18 95 %  01/13/24 0006 -- -- -- 100 -- 91 %  01/12/24 1936 138/75 98 F (36.7 C) Oral 75 18 95 %  01/12/24 1917 (!) 163/81 -- -- -- -- --  01/12/24 1915 (!) 162/80 -- -- -- -- --  01/12/24 1914 (!) 157/85 -- -- -- -- --  01/12/24 1912 (!) 163/81 -- -- -- -- --  01/12/24 1900 (!) 185/93 -- -- 83 -- 99 %  01/12/24 1849 (!) 176/87 -- -- 80 -- 94 %  01/12/24 1845 (!)  181/80 -- -- 72 -- 98 %  01/12/24 1836 (!) 157/92 -- -- -- -- --  01/12/24 1830 (!) 157/92 -- -- 67 -- 97 %  01/12/24 1815 (!) 165/80 -- -- 62 -- 96 %  01/12/24 1800 (!) 168/87 -- -- -- -- --  01/12/24 1745 (!) 187/90 -- -- -- -- --  01/12/24 1735 (!) 188/102 -- -- -- -- --  01/12/24 1730 (!) 184/89 -- -- -- -- --  01/12/24 1728 (!) 180/93 -- -- -- -- --  01/12/24 1725 (!) 169/95 -- -- -- -- --  01/12/24 1720 (!) 174/81 -- -- -- -- --  01/12/24 1719 (!) 164/77 -- -- -- -- --  01/12/24 1715 125/72 -- -- -- -- --  01/12/24 1714 (!) 156/124 -- -- -- -- --  01/12/24 1711 (!) 176/86 -- -- -- -- --  01/12/24 1700 (!) 190/98 -- -- 61 -- 98 %  01/12/24 1636 (!) 186/91 97.6 F (36.4 C) -- 63 -- 96 %  01/12/24 1630 -- (!) 97 F (36.1 C) -- 69 18 93 %  01/12/24 1615 (!) 181/92 -- -- 70 12 92 %  01/12/24 1600 (!) 160/81 -- -- 64 15 96 %  01/12/24 1558 -- --  -- 65 19 95 %  01/12/24 1545 (!) 143/85 -- -- 66 17 92 %  01/12/24 1530 (!) 150/91 -- -- 68 12 97 %  01/12/24 1527 (!) 150/74 97.6 F (36.4 C) -- 81 17 100 %     Recent laboratory studies: No results for input(s): WBC, HGB, HCT, PLT, NA, K, CL, CO2, BUN, CREATININE, GLUCOSE, INR, CALCIUM in the last 72 hours.  Invalid input(s): PT, 2   Discharge Medications:   Allergies as of 01/13/2024       Reactions   Codeine Other (See Comments)   Doesn't really like        Medication List     STOP taking these medications    acetaminophen  500 MG tablet Commonly known as: TYLENOL        TAKE these medications    aspirin  EC 81 MG tablet Take 1 tablet (81 mg total) by mouth 2 (two) times daily.   BIOTIN PO Take 1 tablet by mouth daily.   carboxymethylcellulose 0.5 % Soln Commonly known as: REFRESH PLUS Place 1 drop into both eyes daily as needed (dry/irritated eyes).   cyanocobalamin 500 MCG tablet Commonly known as: VITAMIN B12 Take 500 mcg by mouth daily.   ICY HOT EX Apply 1 Application topically daily as needed (pain).   meloxicam 15 MG tablet Commonly known as: MOBIC Take 15 mg by mouth daily as needed for pain.   MOVE FREE PO Take 1 tablet by mouth daily.   nystatin -triamcinolone  cream Commonly known as: MYCOLOG II Apply topically 2 (two) times daily. APPLY TO AFFECTED AREA TWICE A DAY   oxyCODONE -acetaminophen  5-325 MG tablet Commonly known as: PERCOCET/ROXICET Take 1 tablet by mouth every 4 (four) hours as needed for severe pain (pain score 7-10).   Prempro  0.3-1.5 MG tablet Generic drug: estrogen (conjugated)-medroxyprogesterone  Take 1 tablet by mouth daily.   PRESERVISION/LUTEIN PO Take 1 capsule by mouth every other day.   tiZANidine  2 MG tablet Commonly known as: ZANAFLEX  Take 1 tablet (2 mg total) by mouth every 6 (six) hours as needed for muscle spasms.   VITAMIN D  PO Take 1 tablet by mouth daily.   ZINC  PO Take 1 tablet by mouth daily.  Durable Medical Equipment  (From admission, onward)           Start     Ordered   01/12/24 1945  DME Walker rolling  Once       Question:  Patient needs a walker to treat with the following condition  Answer:  Status post right hip replacement   01/12/24 1944   01/12/24 1945  DME 3 n 1  Once        01/12/24 1944              Discharge Care Instructions  (From admission, onward)           Start     Ordered   01/13/24 0000  Weight bearing as tolerated        01/13/24 1327   01/12/24 0000  Weight bearing as tolerated        01/12/24 1522            Diagnostic Studies: DG HIP UNILAT W OR W/O PELVIS 2-3 VIEWS RIGHT Result Date: 01/12/2024 CLINICAL DATA:  Status post right hip arthroplasty revision EXAM: DG HIP (WITH OR WITHOUT PELVIS) 2-3V RIGHT COMPARISON:  CT from 11/06/2023 FINDINGS: Revision of the acetabular component of the right hip prosthesis is noted. No acute fracture or dislocation is noted. No acute soft tissue abnormality is seen. IMPRESSION: Status post revision of the acetabular component Electronically Signed   By: Oneil Devonshire M.D.   On: 01/12/2024 19:50    Disposition: Discharge disposition: 01-Home or Self Care       Discharge Instructions     Call MD / Call 911   Complete by: As directed    If you experience chest pain or shortness of breath, CALL 911 and be transported to the hospital emergency room.  If you develope a fever above 101 F, pus (white drainage) or increased drainage or redness at the wound, or calf pain, call your surgeon's office.   Call MD / Call 911   Complete by: As directed    If you experience chest pain or shortness of breath, CALL 911 and be transported to the hospital emergency room.  If you develope a fever above 101 F, pus (white drainage) or increased drainage or redness at the wound, or calf pain, call your surgeon's office.   Constipation Prevention    Complete by: As directed    Drink plenty of fluids.  Prune juice may be helpful.  You may use a stool softener, such as Colace (over the counter) 100 mg twice a day.  Use MiraLax  (over the counter) for constipation as needed.   Constipation Prevention   Complete by: As directed    Drink plenty of fluids.  Prune juice may be helpful.  You may use a stool softener, such as Colace (over the counter) 100 mg twice a day.  Use MiraLax  (over the counter) for constipation as needed.   Diet - low sodium heart healthy   Complete by: As directed    Driving restrictions   Complete by: As directed    No driving for 2 weeks   Driving restrictions   Complete by: As directed    No driving for 2 weeks   Increase activity slowly as tolerated   Complete by: As directed    Increase activity slowly as tolerated   Complete by: As directed    Patient may shower   Complete by: As directed    You may shower without a dressing once  there is no drainage.  Do not wash over the wound.  If drainage remains, cover wound with plastic wrap and then shower.   Patient may shower   Complete by: As directed    You may shower without a dressing once there is no drainage.  Do not wash over the wound.  If drainage remains, cover wound with plastic wrap and then shower.   Post-operative opioid taper instructions:   Complete by: As directed    POST-OPERATIVE OPIOID TAPER INSTRUCTIONS: It is important to wean off of your opioid medication as soon as possible. If you do not need pain medication after your surgery it is ok to stop day one. Opioids include: Codeine, Hydrocodone(Norco, Vicodin), Oxycodone (Percocet, oxycontin ) and hydromorphone  amongst others.  Long term and even short term use of opiods can cause: Increased pain response Dependence Constipation Depression Respiratory depression And more.  Withdrawal symptoms can include Flu like symptoms Nausea, vomiting And more Techniques to manage these  symptoms Hydrate well Eat regular healthy meals Stay active Use relaxation techniques(deep breathing, meditating, yoga) Do Not substitute Alcohol  to help with tapering If you have been on opioids for less than two weeks and do not have pain than it is ok to stop all together.  Plan to wean off of opioids This plan should start within one week post op of your joint replacement. Maintain the same interval or time between taking each dose and first decrease the dose.  Cut the total daily intake of opioids by one tablet each day Next start to increase the time between doses. The last dose that should be eliminated is the evening dose.      Post-operative opioid taper instructions:   Complete by: As directed    POST-OPERATIVE OPIOID TAPER INSTRUCTIONS: It is important to wean off of your opioid medication as soon as possible. If you do not need pain medication after your surgery it is ok to stop day one. Opioids include: Codeine, Hydrocodone(Norco, Vicodin), Oxycodone (Percocet, oxycontin ) and hydromorphone  amongst others.  Long term and even short term use of opiods can cause: Increased pain response Dependence Constipation Depression Respiratory depression And more.  Withdrawal symptoms can include Flu like symptoms Nausea, vomiting And more Techniques to manage these symptoms Hydrate well Eat regular healthy meals Stay active Use relaxation techniques(deep breathing, meditating, yoga) Do Not substitute Alcohol  to help with tapering If you have been on opioids for less than two weeks and do not have pain than it is ok to stop all together.  Plan to wean off of opioids This plan should start within one week post op of your joint replacement. Maintain the same interval or time between taking each dose and first decrease the dose.  Cut the total daily intake of opioids by one tablet each day Next start to increase the time between doses. The last dose that should be eliminated is  the evening dose.      Weight bearing as tolerated   Complete by: As directed    Weight bearing as tolerated   Complete by: As directed         Follow-up Information     Liam Lerner, MD Follow up in 2 week(s).   Specialty: Orthopedic Surgery Contact information: [address] [place] [address] [phone]                  Signed: Camellia Ellen 01/13/2024, 1:27 PM

## 2024-01-13 NOTE — Progress Notes (Signed)
 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 Pt is currently R LE WBAT with posterior hip precautions. Pt PMH includes but is not limited to: OA, and B THA.    PT Comments   Brooke Ureta is a 73 y.o. female POD 1 s/p R THA revision. Patient reports IND with mobility at baseline. Patient is now limited by functional impairments (see PT problem list below) and requires min A for bed mobility and CGA for transfers. Patient was able to ambulate 60 feet with RW and CGA level of assist. Patient instructed in exercise to facilitate ROM and circulation to manage edema. Patient will benefit from continued skilled PT interventions to address impairments and progress towards PLOF. Acute PT will follow to progress mobility and stair training in preparation for safe discharge home with family support and OPPT services. Bp semi reclined 157/79 Bp seated EOB 149/71 Bp standing 147/86 reports of light dizziness     If plan is discharge home, recommend the following: A little help with walking and/or transfers;A little help with bathing/dressing/bathroom;Assistance with cooking/housework;Assist for transportation;Help with stairs or ramp for entrance   Can travel by private vehicle        Equipment Recommendations  None recommended by PT    Recommendations for Other Services       Precautions / Restrictions Precautions Precautions: Fall;Posterior Hip Restrictions Weight Bearing Restrictions Per Provider Order: No     Mobility  Bed Mobility Overal bed mobility: Needs Assistance Bed Mobility: Supine to Sit     Supine to sit: HOB elevated, Min assist     General bed  mobility comments: min cues and A for trunk    Transfers Overall transfer level: Needs assistance Equipment used: Rolling walker (2 wheels) Transfers: Sit to/from Stand Sit to Stand: Contact guard assist           General transfer comment: min cues for UE and AD placement for bed, recliner and commode transefrs    Ambulation/Gait Ambulation/Gait assistance: Contact guard assist Gait Distance (Feet): 60 Feet Assistive device: Rolling walker (2 wheels) Gait Pattern/deviations: Step-to pattern, Antalgic, Trunk flexed, Decreased stance time - right       General Gait Details: decreased   Stairs             Wheelchair Mobility     Tilt Bed    Modified Rankin (Stroke Patients Only)       Balance Overall balance assessment: Needs assistance, History of Falls (1) Sitting-balance support: Feet supported Sitting balance-Leahy Scale: Good     Standing balance support: Bilateral upper extremity supported, During functional activity, Reliant on assistive device for balance Standing balance-Leahy Scale: Fair Standing balance comment: static standing no UE support                            Communication Communication Communication: No apparent difficulties  Cognition Arousal: Alert Behavior During Therapy: WFL for tasks assessed/performed   PT - Cognitive impairments: No apparent impairments                         Following commands: Intact      Cueing  Exercises Total Joint Exercises Ankle Circles/Pumps: AROM, Both, 10 reps Hip ABduction/ADduction: AROM, Right, 5 reps, Standing Knee Flexion: AROM, Right, 5 reps, Standing Standing Hip Extension: AROM, Right, 5 reps, Standing    General Comments        Pertinent Vitals/Pain Pain Assessment Pain Assessment: 0-10 Pain Score: 5  Pain Location: R hip and LE Pain Descriptors / Indicators: Aching, Constant, Discomfort, Dull, Grimacing, Operative site guarding, Restless Pain  Intervention(s): Limited activity within patient's tolerance, Monitored during session, Premedicated before session, Repositioned, Ice applied    Home Living                          Prior Function            PT Goals (current goals can now be found in the care plan section) Acute Rehab PT Goals Patient Stated Goal: return to playing pickleball in 6 wks PT Goal Formulation: With patient Time For Goal Achievement: 01/26/24 Potential to Achieve Goals: Good Progress towards PT goals: Progressing toward goals    Frequency    7X/week      PT Plan      Co-evaluation              AM-PAC PT 6 Clicks Mobility   Outcome Measure  Help needed turning from your back to your side while in a flat bed without using bedrails?: None Help needed moving from lying on your back to sitting on the side of a flat bed without using bedrails?: A Little Help needed moving to and from a bed to a chair (including a wheelchair)?: A Little Help needed standing up from a chair using your arms (e.g., wheelchair or bedside chair)?: A Little Help needed to walk in hospital room?: A Little Help needed climbing 3-5 steps with a railing? : Total 6 Click Score: 17    End of Session Equipment Utilized During Treatment: Gait belt Activity Tolerance: Patient tolerated treatment well;No increased pain Patient left: in bed;with call bell/phone within reach Nurse Communication: Mobility status PT Visit Diagnosis: Unsteadiness on feet (R26.81);Other abnormalities of gait and mobility (R26.89);Muscle weakness (generalized) (M62.81);History of falling (Z91.81);Difficulty in walking, not elsewhere classified (R26.2);Pain Pain - Right/Left: Right Pain - part of body: Hip;Leg     Time: 8991-8957 PT Time Calculation (min) (ACUTE ONLY): 34 min  Charges:    $Gait Training: 8-22 mins $Therapeutic Activity: 8-22 mins PT General Charges $$ ACUTE PT VISIT: 1 Visit                     [place],  PT Acute Rehab    [place] VEAR Drone 01/13/2024, 10:51 AM
